# Patient Record
Sex: Male | Born: 1935 | Race: White | Hispanic: No | Marital: Married | Smoking: Former smoker
Health system: Southern US, Community
[De-identification: ages and names within clinical notes are randomized; demographics above are authoritative.]

## PROBLEM LIST (undated history)

## (undated) DIAGNOSIS — I4891 Unspecified atrial fibrillation: Secondary | ICD-10-CM

## (undated) DIAGNOSIS — I639 Cerebral infarction, unspecified: Secondary | ICD-10-CM

## (undated) DIAGNOSIS — E041 Nontoxic single thyroid nodule: Secondary | ICD-10-CM

## (undated) DIAGNOSIS — G459 Transient cerebral ischemic attack, unspecified: Secondary | ICD-10-CM

## (undated) DIAGNOSIS — Z992 Dependence on renal dialysis: Secondary | ICD-10-CM

## (undated) DIAGNOSIS — C61 Malignant neoplasm of prostate: Secondary | ICD-10-CM

## (undated) DIAGNOSIS — I739 Peripheral vascular disease, unspecified: Secondary | ICD-10-CM

## (undated) DIAGNOSIS — E782 Mixed hyperlipidemia: Secondary | ICD-10-CM

## (undated) DIAGNOSIS — G40909 Epilepsy, unspecified, not intractable, without status epilepticus: Secondary | ICD-10-CM

## (undated) DIAGNOSIS — J449 Chronic obstructive pulmonary disease, unspecified: Secondary | ICD-10-CM

## (undated) DIAGNOSIS — I779 Disorder of arteries and arterioles, unspecified: Secondary | ICD-10-CM

## (undated) DIAGNOSIS — N186 End stage renal disease: Secondary | ICD-10-CM

## (undated) DIAGNOSIS — I10 Essential (primary) hypertension: Secondary | ICD-10-CM

## (undated) DIAGNOSIS — Z9289 Personal history of other medical treatment: Secondary | ICD-10-CM

## (undated) DIAGNOSIS — H509 Unspecified strabismus: Secondary | ICD-10-CM

## (undated) HISTORY — DX: Unspecified strabismus: H50.9

## (undated) HISTORY — DX: Mixed hyperlipidemia: E78.2

## (undated) HISTORY — PX: PROSTATE SURGERY: SHX751

## (undated) HISTORY — DX: Transient cerebral ischemic attack, unspecified: G45.9

## (undated) HISTORY — DX: Chronic obstructive pulmonary disease, unspecified: J44.9

## (undated) HISTORY — DX: Cerebral infarction, unspecified: I63.9

## (undated) HISTORY — DX: Unspecified atrial fibrillation: I48.91

## (undated) HISTORY — DX: End stage renal disease: Z99.2

## (undated) HISTORY — DX: Epilepsy, unspecified, not intractable, without status epilepticus: G40.909

## (undated) HISTORY — DX: Essential (primary) hypertension: I10

## (undated) HISTORY — DX: Disorder of arteries and arterioles, unspecified: I77.9

## (undated) HISTORY — PX: INSERTION OF DIALYSIS CATHETER: SHX1324

## (undated) HISTORY — DX: Malignant neoplasm of prostate: C61

## (undated) HISTORY — DX: Peripheral vascular disease, unspecified: I73.9

## (undated) HISTORY — DX: End stage renal disease: N18.6

---
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
## 1973-08-29 HISTORY — PX: OTHER SURGICAL HISTORY: SHX169

## 2004-07-15 ENCOUNTER — Ambulatory Visit: Admission: RE | Admit: 2004-07-15 | Discharge: 2004-10-12 | Payer: Self-pay | Admitting: Radiation Oncology

## 2006-08-29 DIAGNOSIS — Z9289 Personal history of other medical treatment: Secondary | ICD-10-CM

## 2006-08-29 HISTORY — PX: OTHER SURGICAL HISTORY: SHX169

## 2006-08-29 HISTORY — DX: Personal history of other medical treatment: Z92.89

## 2007-05-25 ENCOUNTER — Ambulatory Visit (HOSPITAL_COMMUNITY): Admission: RE | Admit: 2007-05-25 | Discharge: 2007-05-25 | Payer: Self-pay | Admitting: Internal Medicine

## 2008-08-29 HISTORY — PX: HERNIA REPAIR: SHX51

## 2009-11-12 ENCOUNTER — Encounter: Payer: Self-pay | Admitting: Cardiology

## 2009-11-13 ENCOUNTER — Ambulatory Visit: Payer: Self-pay | Admitting: Cardiology

## 2009-11-13 ENCOUNTER — Encounter: Payer: Self-pay | Admitting: Cardiology

## 2009-11-15 ENCOUNTER — Encounter: Payer: Self-pay | Admitting: Cardiology

## 2009-11-16 ENCOUNTER — Encounter: Payer: Self-pay | Admitting: Cardiology

## 2009-11-17 ENCOUNTER — Encounter: Payer: Self-pay | Admitting: Cardiology

## 2009-11-17 ENCOUNTER — Inpatient Hospital Stay (HOSPITAL_COMMUNITY)
Admission: RE | Admit: 2009-11-17 | Discharge: 2009-12-01 | Payer: Self-pay | Admitting: Physical Medicine & Rehabilitation

## 2009-11-17 ENCOUNTER — Ambulatory Visit: Payer: Self-pay | Admitting: Physical Medicine & Rehabilitation

## 2009-11-17 ENCOUNTER — Ambulatory Visit: Payer: Self-pay | Admitting: Internal Medicine

## 2009-11-18 ENCOUNTER — Encounter: Payer: Self-pay | Admitting: Cardiology

## 2009-11-19 ENCOUNTER — Encounter: Payer: Self-pay | Admitting: Internal Medicine

## 2009-11-24 ENCOUNTER — Encounter: Payer: Self-pay | Admitting: Cardiology

## 2009-12-01 ENCOUNTER — Encounter (INDEPENDENT_AMBULATORY_CARE_PROVIDER_SITE_OTHER): Payer: Self-pay | Admitting: *Deleted

## 2009-12-11 ENCOUNTER — Encounter: Payer: Self-pay | Admitting: Cardiology

## 2009-12-23 ENCOUNTER — Ambulatory Visit (HOSPITAL_COMMUNITY): Admission: RE | Admit: 2009-12-23 | Discharge: 2009-12-23 | Payer: Self-pay | Admitting: Internal Medicine

## 2009-12-23 ENCOUNTER — Encounter: Payer: Self-pay | Admitting: Cardiology

## 2009-12-25 ENCOUNTER — Encounter: Payer: Self-pay | Admitting: Cardiology

## 2010-01-04 ENCOUNTER — Encounter
Admission: RE | Admit: 2010-01-04 | Discharge: 2010-01-05 | Payer: Self-pay | Admitting: Physical Medicine & Rehabilitation

## 2010-01-05 ENCOUNTER — Ambulatory Visit: Payer: Self-pay | Admitting: Physical Medicine & Rehabilitation

## 2010-01-05 ENCOUNTER — Encounter: Payer: Self-pay | Admitting: Cardiology

## 2010-01-08 DIAGNOSIS — G8190 Hemiplegia, unspecified affecting unspecified side: Secondary | ICD-10-CM | POA: Insufficient documentation

## 2010-01-08 DIAGNOSIS — I4891 Unspecified atrial fibrillation: Secondary | ICD-10-CM | POA: Insufficient documentation

## 2010-01-08 DIAGNOSIS — I6340 Cerebral infarction due to embolism of unspecified cerebral artery: Secondary | ICD-10-CM | POA: Insufficient documentation

## 2010-01-08 DIAGNOSIS — N189 Chronic kidney disease, unspecified: Secondary | ICD-10-CM | POA: Insufficient documentation

## 2010-01-11 ENCOUNTER — Ambulatory Visit: Payer: Self-pay | Admitting: Cardiology

## 2010-01-11 DIAGNOSIS — I10 Essential (primary) hypertension: Secondary | ICD-10-CM | POA: Insufficient documentation

## 2010-03-23 ENCOUNTER — Encounter: Payer: Self-pay | Admitting: Cardiology

## 2010-04-02 ENCOUNTER — Encounter: Payer: Self-pay | Admitting: Cardiology

## 2010-04-08 ENCOUNTER — Ambulatory Visit: Payer: Self-pay | Admitting: Cardiology

## 2010-04-08 DIAGNOSIS — I6529 Occlusion and stenosis of unspecified carotid artery: Secondary | ICD-10-CM | POA: Insufficient documentation

## 2010-04-08 DIAGNOSIS — E782 Mixed hyperlipidemia: Secondary | ICD-10-CM | POA: Insufficient documentation

## 2010-06-17 ENCOUNTER — Encounter: Payer: Self-pay | Admitting: Cardiology

## 2010-06-30 ENCOUNTER — Encounter: Payer: Self-pay | Admitting: Cardiology

## 2010-07-09 ENCOUNTER — Encounter: Payer: Self-pay | Admitting: Cardiology

## 2010-07-30 ENCOUNTER — Encounter: Payer: Self-pay | Admitting: Cardiology

## 2010-08-09 ENCOUNTER — Encounter: Payer: Self-pay | Admitting: Cardiology

## 2010-08-09 ENCOUNTER — Ambulatory Visit: Payer: Self-pay | Admitting: Cardiology

## 2010-09-28 NOTE — Letter (Signed)
Summary: [facility] D/C DR. VYAS  [facility] D/C DR. VYAS   Imported By: Zachary George 01/08/2010 16:04:10  _____________________________________________________________________  External Attachment:    Type:   Image     Comment:   External Document

## 2010-09-28 NOTE — Consult Note (Signed)
Summary: CARDIOLOGY CONSULT/ [facility]  [facility]ARDIOLOGY CONSULT/ [facility]   Imported By: Zachary George 01/08/2010 16:12:15  _____________________________________________________________________  External Attachment:    Type:   Image     Comment:   External Document

## 2010-09-28 NOTE — Letter (Signed)
Summary: [place] D/C DR. SWARTZ  [place] D/C DR. ZOXWRU   Imported By: Zachary George 01/08/2010 16:15:22  _____________________________________________________________________  External Attachment:    Type:   Image     Comment:   External Document

## 2010-09-28 NOTE — Letter (Signed)
Summary: [facility]  [facility]   Imported By: Zachary George 01/08/2010 16:03:15  _____________________________________________________________________  External Attachment:    Type:   Image     Comment:   External Document

## 2010-09-28 NOTE — Consult Note (Signed)
Summary: DR. Lewayne Bunting  DR. Nadira Single   Imported By: Zachary George 01/11/2010 09:39:53  _____________________________________________________________________  External Attachment:    Type:   Image     Comment:   External Document

## 2010-09-28 NOTE — Consult Note (Signed)
Summary: NEUROLOGY CONSUT/ [facility]  NEUROLOGY CONSUT/ [facility]   Imported By: Zachary George 01/08/2010 16:06:01  _____________________________________________________________________  External Attachment:    Type:   Image     Comment:   External Document

## 2010-09-28 NOTE — Letter (Signed)
Summary: External Correspondence/ [facility]ICE VISIT [facility]  External Correspondence/ [facility]   Imported By: Dorise Hiss 12/14/2009 14:52:13  _____________________________________________________________________  External Attachment:    Type:   Image     Comment:   External Document

## 2010-09-28 NOTE — Medication Information (Signed)
Summary: RX Folder/ [facility]  RX Folder/ [facility]   Imported By: Dorise Hiss 12/14/2009 14:54:43  _____________________________________________________________________  External Attachment:    Type:   Image     Comment:   External Document

## 2010-09-28 NOTE — Assessment & Plan Note (Signed)
Summary: [facility]   Visit Type:  Follow-up Primary Provider:  Dr. Doreen Beam  CC:  atrial fibrillation.  History of Present Illness: The patient presents for followup of atrial fibrillation. He was hospitalized in March with a stroke and found to be in fibrillation. He has rehabilitation nicely from this. He has been on chronic Coumadin and tolerating this. He does have some residual balance problem, memory problem and left hand numbness. Otherwise he is doing well. He denies any palpitations and has never really felt this rhythm. Prior to the stroke unit in a vigorously active gentleman without cardiovascular history or complaints. He was not having chest pressure, neck or arm discomfort. He was not having palpitations, presyncope or syncope. He was not having any shortness of breath, PND or orthopnea. Of note he had an echocardiogram demonstrating a well-preserved ejection fraction and no significant abnormalities. He did have some mild bilateral carotid stenosis identified.  Preventive Screening-Counseling & Management  Alcohol-Tobacco     Smoking Status: quit     Year Started: 20 yrs +     Year Quit: 1987  Current Medications (verified): 1)  Warfarin Sodium 5 Mg Tabs (Warfarin Sodium) .... Take As Directed By Vyas 2)  Simvastatin 40 Mg Tabs (Simvastatin) .... Take 1/2 Tab At Bedtime 3)  Metoprolol Tartrate 25 Mg Tabs (Metoprolol Tartrate) .... Take 1 Tablet By Mouth Two Times A Day 4)  Amlodipine Besylate 10 Mg Tabs (Amlodipine Besylate) .... Take 1 Tablet By Mouth Once A Day 5)  Vitamin C 500 Mg Tabs (Ascorbic Acid) .... Take 1 Tablet By Mouth Once A Day 6)  Daily Vitamins  Tabs (Multiple Vitamin) .... Take 1 Tablet By Mouth Once A Day  Allergies (verified): No Known Drug Allergies  Past History:  Past Medical History:  1. Renal cancer with left nephrectomy in 2008.   2. Chronic kidney disease with baseline creatinine of 1.8 to 1.9       range.   3. Prostate cancer,  treated with XRT in 2006.   4. Right inguinal hernia repair.   5. History of strabismus, left eye, with decreased vision.   6. Left ankle collapse with eversion.   7. Hypertension.   8. Dyslipidemia.   9.History of TIA.   10. B.ilateral carotid stenosis (50%)     Past Surgical History: Back surgery in 1975, prostate cancer with XRT in 2006, right inguinal hernia repair,left nephrectomy  Social History: Smoking Status:  quit  Review of Systems       As stated in the HPI and negative for all other systems.   Vital Signs:  Patient profile:   77 year old male Height:      72 inches Weight:      187.25 pounds BMI:     25.49 Pulse rate:   56 / minute BP sitting:   150 / 85  (left arm) Cuff size:   regular  Vitals Entered By: Hoover Brunette, LPN (Jan 11, 2010 10:51 AM)  Nutrition Counseling: Patient's BMI is greater than 25 and therefore counseled on weight management options. CC: atrial fibrillation Is Patient Diabetic? No   Physical Exam  General:  Well developed, well nourished, in no acute distress. Head:  normocephalic and atraumatic Mouth:   Oral mucosa normal. Neck:  Neck supple, no JVD. No masses, thyromegaly or abnormal cervical nodes. Chest Wall:  no deformities or breast masses noted Lungs:  Clear bilaterally to auscultation and percussion. Heart:  Non-displaced PMI, chest non-tender; irregular  rate and rhythm, S1, S2 without murmurs, rubs or gallops. Carotid upstroke normal, no bruit. Normal abdominal aortic size, no bruits. Femorals normal pulses, no bruits. Pedals normal pulses.  Abdomen:  Bowel sounds positive; abdomen soft and non-tender without masses, organomegaly, or hernias noted. No hepatosplenomegaly. Msk:  Back normal, normal gait. Muscle strength and tone normal. Extremities:  mild bilateral edema to mid calf Neurologic:  Alert and oriented x 3. Skin:  Intact without lesions or rashes. Psych:  Normal affect.   EKG  Procedure date:   01/11/2010  Findings:      atrial fibrillation, rate 66, axis within normal limits, right bundle branch block, no acute ST-T wave changes  Impression & Recommendations:  Problem # 1:  ATRIAL FIBRILLATION (ICD-427.31) The patient is doing well status post a CVA. He is tolerating Coumadin. He is asymptomatic with his atrial fibrillation. At this point I would not suggest to change in therapy. I do not think that there is a benefit to cardioversion in this situation. I had a long discussion with the patient, his son and wife. He will remain indefinitely on Coumadin. I would suggest he is not a Pradaxa candidate with his renal insufficiency. Orders: EKG w/ Interpretation (93000)  Problem # 2:  CAROTID STENOSIS (ICD-433.10) He will need followup of his carotid stenosis with a Doppler in about a year. This can be arranged when he follows up in this clinic.  Problem # 3:  ESSENTIAL HYPERTENSION, BENIGN (ICD-401.1) His blood pressure is mildly elevated. They should keep a blood pressure diary. I will not respond to this one reading with change in medications. Of note I do suspect that his lower extremity edema is related to amlodipine. Understanding this he still prefer not to stop this medication as this edema is not particularly troublesome.  Patient Instructions: 1)  Your physician wants you to follow-up in: 3 months. You will receive a reminder letter in the mail one-two months in advance. If you don't receive a letter, please call our office to schedule the follow-up appointment. 2)  Your physician recommends that you continue on your current medications as directed. Please refer to the Current Medication list given to you today.  Appended Document: [facility]    Clinical Lists Changes  Medications: Changed medication from METOPROLOL TARTRATE 25 MG TABS (METOPROLOL TARTRATE) Take 1 tablet by mouth two times a day to METOPROLOL TARTRATE 50 MG TABS (METOPROLOL TARTRATE) Take one  tablet by mouth twice a day - Signed Rx of METOPROLOL TARTRATE 50 MG TABS (METOPROLOL TARTRATE) Take one tablet by mouth twice a day;  #180 x 3;  Signed;  Entered by: Cyril Loosen, RN, BSN;  Authorized by: Rollene Rotunda, MD, [facility];  Method used: Electronically to [facility] [address], [place], [place], [place]  [address], Ph: [phone], Fax: [phone]    Prescriptions: METOPROLOL TARTRATE 50 MG TABS (METOPROLOL TARTRATE) Take one tablet by mouth twice a day  #180 x 3   Entered by:   Cyril Loosen, RN, BSN   Authorized by:   Rollene Rotunda, MD, [facility]   Signed by:   Cyril Loosen, RN, BSN on 01/11/2010   Method used:   Electronically to        [facility]* (retail)       [address]       [place], [place]  [address]       Ph: [phone]  Fax: [phone]   RxID:   [id number]

## 2010-09-28 NOTE — Assessment & Plan Note (Signed)
Summary: 71monthfollowup/rm   Visit Type:  Follow-up Primary Provider:  Dr. Doreen Beam   History of Present Illness: 77 year old male presents for followup. He has been followed most recently by Dr. Antoine Poche in our clinic. I saw him in consultation in [place] back in March of this year.  Recent followup labs from 5 August showed BUN 45, creatinine 2.2, sodium 137, potassium 4.8. Lipid profile from July showed cholesterol 125, HDL 39, LDL 67, triglycerides 95, AST 19, ALT 19.  He reports improved lower extremity edema following reduction in amlodipine dosing, and also the temporary addition of Lasix per Dr. Sherril Croon. Blood pressure has however trended up.  He has had no significant palpitations. He continues to do outpatient physical therapy has made significant progress following his stroke.  Preventive Screening-Counseling & Management  Alcohol-Tobacco     Smoking Status: quit     Year Quit: 1987  Current Medications (verified): 1)  Warfarin Sodium 5 Mg Tabs (Warfarin Sodium) .... Take As Directed By Vyas 2)  Simvastatin 40 Mg Tabs (Simvastatin) .... Take 1/2 Tab At Bedtime 3)  Metoprolol Tartrate 50 Mg Tabs (Metoprolol Tartrate) .... Take One Tablet By Mouth Twice A Day 4)  Amlodipine Besylate 10 Mg Tabs (Amlodipine Besylate) .... Take 1/2 Tablet By Mouth Once A Day 5)  Vitamin C 500 Mg Tabs (Ascorbic Acid) .... Take 1 Tablet By Mouth Once A Day 6)  Daily Vitamins  Tabs (Multiple Vitamin) .... Take 1 Tablet By Mouth Once A Day 7)  Furosemide 20 Mg Tabs (Furosemide) .... Take 1 Tablet By Mouth Once A Day  For 10 Days Per Vyas  Allergies (verified): No Known Drug Allergies  Comments:  Nurse/Medical Assistant: The patient's medication bottles and allergies were reviewed with the patient and were updated in the Medication and Allergy Lists.  Past History:  Social History: Last updated: 04/08/2010 Retired - [employer] work Married  Tobacco Use - Former.  Alcohol Use - no  Past  Medical History: Renal cancer with left nephrectomy Chronic kidney disease Prostate cancer, treated with XRT in 2006 History of strabismus, left eye, with decreased vision History of TIA Carotid artery disease Hyperlipidemia Hypertension Atrial Fibrillation Cerebrovascular Disease - right MCA stroke Diabetes Type 2  Past Surgical History: Back surgery Left nephrectomy Right inguinal hernia repair  Family History: Mother died at 70 with CVAs and the heart failure Father died at 48 of an MI     Social History: Retired - mill work Married  Tobacco Use - Former.  Alcohol Use - no  Review of Systems  The patient denies anorexia, fever, chest pain, syncope, dyspnea on exertion, peripheral edema, melena, and hematochezia.         Otherwise reviewed and negative.  Vital Signs:  Patient profile:   77 year old male Height:      72 inches Weight:      183 pounds Pulse rate:   78 / minute BP sitting:   149 / 90  (left arm) Cuff size:   regular  Vitals Entered By: Carlye Grippe (April 08, 2010 9:46 AM)  Serial Vital Signs/Assessments:  Time      Position  BP       Pulse  Resp  Temp     By 9:53 AM             144/85   64                    Carlye Grippe  Comments: 9:53  AM left arm/reg cuff  By: Carlye Grippe    Physical Exam  Additional Exam:  Normally nourished appearing male no acute distress. HEENT: Conjunctiva and lids normal, oropharynx clear. Neck: Supple, no elevated JVP or bruits. Lungs: Clear auscultation, nonlabored. Cardiac: Irregularly irregular, no S3. Abdomen: Soft, nontender, bowel sounds present. Skin: Warm and dry. Musculoskeletal: No kyphosis. Neuropsychiatric: Alert and oriented x3, affect appropriate.    Echocardiogram  Procedure date:  11/13/2009  Findings:      Normal left ventricular chamber size, mild left ventricular hypertrophy, and normal contraction with LVEF 60%. Normal right ventricular function. Mild mitral  regurgitation, trace tricuspid regurgitation.  Carotid Doppler  Procedure date:  11/13/2009  Findings:      Less than 50% stenosis in the right and left internal carotid arteries.  EKG  Procedure date:  04/08/2010  Findings:      Atrial fibrillation at 57 beats per minutes with right bundle branch block.  Impression & Recommendations:  Problem # 1:  ATRIAL FIBRILLATION (ICD-427.31)  Symptomatically stable. Continues on Coumadin without reported bleeding problems. Rate adequately controlled with metoprolol.  His updated medication list for this problem includes:    Warfarin Sodium 5 Mg Tabs (Warfarin sodium) .Marland Kitchen... Take as directed by vyas    Metoprolol Tartrate 50 Mg Tabs (Metoprolol tartrate) .Marland Kitchen... Take one tablet by mouth twice a day  Orders: EKG w/ Interpretation (93000)  Problem # 2:  ESSENTIAL HYPERTENSION, BENIGN (ICD-401.1)  Blood pressure trend is up. Amlodipine dose was reduced secondary to lower extremity edema. He feels much better symptomatically, and I expect he will probably come off of Lasix after review with Dr. Sherril Croon. Could consider additional blood pressure control measures such as hydralazine.  His updated medication list for this problem includes:    Metoprolol Tartrate 50 Mg Tabs (Metoprolol tartrate) .Marland Kitchen... Take one tablet by mouth twice a day    Amlodipine Besylate 10 Mg Tabs (Amlodipine besylate) .Marland Kitchen... Take 1/2 tablet by mouth once a day    Furosemide 20 Mg Tabs (Furosemide) .Marland Kitchen... Take 1 tablet by mouth once a day  for 10 days per vyas  Problem # 3:  MIXED HYPERLIPIDEMIA (ICD-272.2)  Tolerating simvastatin. LDL was at goal recently. Plan followup lipid profile liver function tests prior to his next visit.  His updated medication list for this problem includes:    Simvastatin 40 Mg Tabs (Simvastatin) .Marland Kitchen... Take 1/2 tab at bedtime  Problem # 4:  CAROTID ARTERY DISEASE (ICD-433.10)  Nonobstructive. Followup for carotid Dopplers sometime around March of  next year.  His updated medication list for this problem includes:    Warfarin Sodium 5 Mg Tabs (Warfarin sodium) .Marland Kitchen... Take as directed by vyas  Patient Instructions: 1)  Your physician wants you to follow-up in: 4 months. You will receive a reminder letter in the mail one-two months in advance. If you don't receive a letter, please call our office to schedule the follow-up appointment. 2)  Your physician recommends that you go to the [facility] for lab work: DO BEFORE YOU NEXT OFFICE VISIT. (FLP/LFT/BMET) 3)  Your physician recommends that you continue on your current medications as directed. Please refer to the Current Medication list given to you today.

## 2010-09-28 NOTE — Progress Notes (Signed)
Summary: OFFICE VISIT (DR.ZACHARY [phone])  OFFICE VISIT (DR.ZACHARY [phone])   Imported By: Zachary George 01/11/2010 09:33:28  _____________________________________________________________________  External Attachment:    Type:   Image     Comment:   External Document

## 2010-09-28 NOTE — Miscellaneous (Signed)
Summary: Orders Update  Clinical Lists Changes  Problems: Added new problem of LONG-TERM (CURRENT) USE OF OTHER MEDICATIONS (ICD-V58.69) Orders: Added new Test order of T-Lipid Profile [phone]) - Signed Added new Test order of T-Hepatic Function [phone]) - Signed Added new Test order of T-Basic Metabolic Panel [phone]) - Signed

## 2010-09-30 NOTE — Assessment & Plan Note (Signed)
Summary: 4 mo fu per dec remidner-srs   Visit Type:  Follow-up Primary Provider:  Dr. Doreen Beam   History of Present Illness: 77 year old male presents for followup. He was seen back in August. He is here with his wife and son. Reports recent follow up with Dr. Sherril Croon, and medication adjustments made just this past Friday.  Recent labs from 2 December showed BUN 45, creatinine 2.3, AST 18, ALT 13, cholesterol 131, triglycerides 120, HDL 42, LDL 65, sodium 139, potassium 4.8. We reviewed these. He states he recently had followup at [facility] with his history of nephrectomy.  He does not report any problems with progressive palpitations, no significant breathlessness or chest pain. Main limitation to advancing doses of Norvasc has been lower extremity edema.  He denies any bleeding problems on Coumadin.  Preventive Screening-Counseling & Management  Alcohol-Tobacco     Smoking Status: quit     Year Quit: 1987  Current Medications (verified): 1)  Warfarin Sodium 5 Mg Tabs (Warfarin Sodium) .... Take As Directed By Vyas 2)  Simvastatin 40 Mg Tabs (Simvastatin) .... Take 1/2 Tab At Bedtime 3)  Metoprolol Tartrate 50 Mg Tabs (Metoprolol Tartrate) .... Take One Tablet By Mouth Twice A Day 4)  Exforge 5-160 Mg Tabs (Amlodipine Besylate-Valsartan) .... Take 1 Tablet By Mouth Once A Day 5)  Vitamin C 500 Mg Tabs (Ascorbic Acid) .... Take 1 Tablet By Mouth Once A Day 6)  Daily Vitamins  Tabs (Multiple Vitamin) .... Take 1 Tablet By Mouth Once A Day  Allergies (verified): No Known Drug Allergies  Comments:  Nurse/Medical Assistant: The patient's medication list and allergies were reviewed with the patient and were updated in the Medication and Allergy Lists.  Past History:  Past Medical History: Last updated: 04/08/2010 Renal cancer with left nephrectomy Chronic kidney disease Prostate cancer, treated with XRT in 2006 History of strabismus, left eye, with decreased vision History of  TIA Carotid artery disease Hyperlipidemia Hypertension Atrial Fibrillation Cerebrovascular Disease - right MCA stroke Diabetes Type 2  Social History: Last updated: 04/08/2010 Retired - [employer] work Married  Tobacco Use - Former.  Alcohol Use - no  Review of Systems       The patient complains of peripheral edema.  The patient denies anorexia, fever, weight gain, chest pain, syncope, dyspnea on exertion, hemoptysis, melena, and hematochezia.         Otherwise reviewed and negative except as outlined.  Vital Signs:  Patient profile:   77 year old male Height:      72 inches Weight:      188 pounds Pulse rate:   56 / minute BP sitting:   160 / 95  (left arm) Cuff size:   large  Vitals Entered By: Carlye Grippe (August 09, 2010 10:45 AM)  Physical Exam  Additional Exam:  Normally nourished appearing male no acute distress. HEENT: Conjunctiva and lids normal, oropharynx clear. Neck: Supple, no elevated JVP or bruits. Lungs: Clear auscultation, nonlabored. Cardiac: Irregularly irregular, no S3. Abdomen: Soft, nontender, bowel sounds present. Skin: Warm and dry. Musculoskeletal: No kyphosis. Neuropsychiatric: Alert and oriented x3, affect appropriate.    EKG  Procedure date:  08/09/2010  Findings:      Atrial fibrillation at 63 beats per minute with right bundle branch block.  Impression & Recommendations:  Problem # 1:  ATRIAL FIBRILLATION (ICD-427.31)  Rate controlled, tolerating Coumadin and metoprolol. No changes made today. Continues to have INR check with Dr. Sherril Croon. ECG reviewed. Followup in 4 months.  His updated medication list for this problem includes:    Warfarin Sodium 5 Mg Tabs (Warfarin sodium) .Marland Kitchen... Take as directed by vyas    Metoprolol Tartrate 50 Mg Tabs (Metoprolol tartrate) .Marland Kitchen... Take one tablet by mouth twice a day  Problem # 2:  ESSENTIAL HYPERTENSION, BENIGN (ICD-401.1)  Not well-controlled. Patient recently switched from amlodipine to  Exforge as noted above, also taken off Lasix. Depending on his blood pressure control, follow up renal function, and peripheral edema, he may need additional adjustments. Not certain that he will tolerate advancing doses of Exforge related to the amlodipine component and his problems with edema in the past. Would give consideration to hydralazine as an adjunct.  The following medications were removed from the medication list:    Furosemide 20 Mg Tabs (Furosemide) .Marland Kitchen... Take 1 tablet by mouth once a day  for 10 days per vyas His updated medication list for this problem includes:    Metoprolol Tartrate 50 Mg Tabs (Metoprolol tartrate) .Marland Kitchen... Take one tablet by mouth twice a day    Exforge 5-160 Mg Tabs (Amlodipine besylate-valsartan) .Marland Kitchen... Take 1 tablet by mouth once a day  Problem # 3:  CHRONIC KIDNEY DISEASE UNSPECIFIED (ICD-585.9)  Recent renal function reviewed. Patient continues followup with [facility] and Dr. Sherril Croon.  Other Orders: EKG w/ Interpretation (93000)  Patient Instructions: 1)  Your physician wants you to follow-up in: 4 months. You will receive a reminder letter in the mail one-two months in advance. If you don't receive a letter, please call our office to schedule the follow-up appointment. 2)  Your physician recommends that you continue on your current medications as directed. Please refer to the Current Medication list given to you today.

## 2010-11-16 LAB — GLUCOSE, CAPILLARY: Glucose-Capillary: 102 mg/dL — ABNORMAL HIGH (ref 70–99)

## 2010-11-17 LAB — CBC
HCT: 35 % — ABNORMAL LOW (ref 39.0–52.0)
Hemoglobin: 12.1 g/dL — ABNORMAL LOW (ref 13.0–17.0)
MCHC: 34.3 g/dL (ref 30.0–36.0)
MCV: 93.2 fL (ref 78.0–100.0)
Platelets: 256 10*3/uL (ref 150–400)
RBC: 3.98 MIL/uL — ABNORMAL LOW (ref 4.22–5.81)
RDW: 12.9 % (ref 11.5–15.5)
RDW: 13 % (ref 11.5–15.5)
WBC: 6.2 10*3/uL (ref 4.0–10.5)

## 2010-11-17 LAB — BASIC METABOLIC PANEL
BUN: 58 mg/dL — ABNORMAL HIGH (ref 6–23)
CO2: 23 mEq/L (ref 19–32)
Calcium: 8.6 mg/dL (ref 8.4–10.5)
Chloride: 109 mEq/L (ref 96–112)
GFR calc Af Amer: 32 mL/min — ABNORMAL LOW (ref >60)
GFR calc non Af Amer: 27 mL/min — ABNORMAL LOW (ref >60)
Glucose, Bld: 103 mg/dL — ABNORMAL HIGH (ref 70–99)
Potassium: 4.8 mEq/L (ref 3.5–5.1)

## 2010-11-17 LAB — PROTIME-INR
INR: 1.92 — ABNORMAL HIGH (ref 0.00–1.49)
INR: 2.18 — ABNORMAL HIGH (ref 0.00–1.49)
Prothrombin Time: 20.9 seconds — ABNORMAL HIGH (ref 11.6–15.2)

## 2010-11-22 LAB — BASIC METABOLIC PANEL
Calcium: 8.6 mg/dL (ref 8.4–10.5)
Chloride: 109 mEq/L (ref 96–112)
Creatinine, Ser: 2.25 mg/dL — ABNORMAL HIGH (ref 0.4–1.5)
GFR calc Af Amer: 33 mL/min — ABNORMAL LOW (ref >60)
GFR calc Af Amer: 34 mL/min — ABNORMAL LOW (ref >60)
GFR calc non Af Amer: 27 mL/min — ABNORMAL LOW (ref >60)
Potassium: 4.9 mEq/L (ref 3.5–5.1)
Sodium: 139 mEq/L (ref 135–145)

## 2010-11-22 LAB — COMPREHENSIVE METABOLIC PANEL
ALT: 17 U/L (ref 0–53)
AST: 25 U/L (ref 0–37)
Alkaline Phosphatase: 68 U/L (ref 39–117)
BUN: 24 mg/dL — ABNORMAL HIGH (ref 6–23)
CO2: 22 mEq/L (ref 19–32)
CO2: 22 mEq/L (ref 19–32)
Calcium: 9 mg/dL (ref 8.4–10.5)
Calcium: 9.1 mg/dL (ref 8.4–10.5)
Chloride: 111 mEq/L (ref 96–112)
Creatinine, Ser: 1.81 mg/dL — ABNORMAL HIGH (ref 0.4–1.5)
GFR calc Af Amer: 43 mL/min — ABNORMAL LOW (ref >60)
GFR calc non Af Amer: 36 mL/min — ABNORMAL LOW (ref >60)
GFR calc non Af Amer: 37 mL/min — ABNORMAL LOW (ref >60)
Potassium: 4.1 mEq/L (ref 3.5–5.1)
Sodium: 139 mEq/L (ref 135–145)
Total Bilirubin: 0.5 mg/dL (ref 0.3–1.2)

## 2010-11-22 LAB — CBC
HCT: 36.1 % — ABNORMAL LOW (ref 39.0–52.0)
HCT: 36.4 % — ABNORMAL LOW (ref 39.0–52.0)
HCT: 39.9 % (ref 39.0–52.0)
Hemoglobin: 12.2 g/dL — ABNORMAL LOW (ref 13.0–17.0)
MCHC: 34.2 g/dL (ref 30.0–36.0)
MCHC: 34.5 g/dL (ref 30.0–36.0)
MCV: 93.6 fL (ref 78.0–100.0)
MCV: 93.6 fL (ref 78.0–100.0)
Platelets: 206 10*3/uL (ref 150–400)
Platelets: 235 10*3/uL (ref 150–400)
Platelets: 264 10*3/uL (ref 150–400)
RBC: 3.89 MIL/uL — ABNORMAL LOW (ref 4.22–5.81)
RBC: 4.27 MIL/uL (ref 4.22–5.81)
RDW: 12.6 % (ref 11.5–15.5)
WBC: 5.4 10*3/uL (ref 4.0–10.5)
WBC: 5.7 10*3/uL (ref 4.0–10.5)
WBC: 5.9 10*3/uL (ref 4.0–10.5)
WBC: 6.3 10*3/uL (ref 4.0–10.5)
WBC: 6.7 10*3/uL (ref 4.0–10.5)

## 2010-11-22 LAB — LIPID PANEL
Cholesterol: 126 mg/dL (ref 0–200)
LDL Cholesterol: 72 mg/dL (ref 0–99)
VLDL: 18 mg/dL (ref 0–40)

## 2010-11-22 LAB — HOMOCYSTEINE: Homocysteine: 14.4 umol/L (ref 4.0–15.4)

## 2010-11-22 LAB — PROTIME-INR
INR: 1.91 — ABNORMAL HIGH (ref 0.00–1.49)
INR: 2.24 — ABNORMAL HIGH (ref 0.00–1.49)
INR: 2.43 — ABNORMAL HIGH (ref 0.00–1.49)
INR: 2.69 — ABNORMAL HIGH (ref 0.00–1.49)
Prothrombin Time: 21.7 seconds — ABNORMAL HIGH (ref 11.6–15.2)
Prothrombin Time: 24.6 seconds — ABNORMAL HIGH (ref 11.6–15.2)
Prothrombin Time: 25.4 seconds — ABNORMAL HIGH (ref 11.6–15.2)
Prothrombin Time: 28.4 seconds — ABNORMAL HIGH (ref 11.6–15.2)

## 2010-11-22 LAB — DIFFERENTIAL
Basophils Absolute: 0 10*3/uL (ref 0.0–0.1)
Lymphocytes Relative: 17 % (ref 12–46)
Lymphs Abs: 1.1 10*3/uL (ref 0.7–4.0)
Neutro Abs: 4.1 10*3/uL (ref 1.7–7.7)
Neutrophils Relative %: 65 % (ref 43–77)

## 2010-11-22 LAB — HEPARIN LEVEL (UNFRACTIONATED)
Heparin Unfractionated: 0.22 IU/mL — ABNORMAL LOW (ref 0.30–0.70)
Heparin Unfractionated: 0.37 IU/mL (ref 0.30–0.70)

## 2010-11-22 LAB — CARDIAC PANEL(CRET KIN+CKTOT+MB+TROPI): CK, MB: 4.2 ng/mL — ABNORMAL HIGH (ref 0.3–4.0)

## 2010-12-15 ENCOUNTER — Encounter: Payer: Self-pay | Admitting: Cardiology

## 2010-12-16 ENCOUNTER — Encounter: Payer: Self-pay | Admitting: Cardiology

## 2010-12-16 ENCOUNTER — Ambulatory Visit (INDEPENDENT_AMBULATORY_CARE_PROVIDER_SITE_OTHER): Payer: Medicare Other | Admitting: Cardiology

## 2010-12-16 VITALS — BP 148/86 | HR 56 | Ht 72.0 in | Wt 187.0 lb

## 2010-12-16 DIAGNOSIS — I4891 Unspecified atrial fibrillation: Secondary | ICD-10-CM

## 2010-12-16 NOTE — Assessment & Plan Note (Signed)
Followed by primary care, LDL has been at goal.

## 2010-12-16 NOTE — Patient Instructions (Signed)
Your physician wants you to follow-up in: 6 months. You will receive a reminder letter in the mail one-two months in advance. If you don't receive a letter, please call our office to schedule the follow-up appointment. Your physician recommends that you continue on your current medications as directed. Please refer to the Current Medication list given to you today. 

## 2010-12-16 NOTE — Progress Notes (Signed)
Clinical Summary Mr. Gieselman is a 77 y.o.male presenting for followup. He was seen in December 2011.  Lab work from December 2011 showed BUN 45, creatinine 2.3, AST 18, ALT 13, cholesterol 131, triglycerides 120, HDL 42, LDL 65, sodium 139, potassium 4.8.  He reports no significant palpitations or chest pain, indicates NYHA class 2-3 dyspnea exertion which has been stable. Reports diagnosis of seizure disorder by Dr. Ninetta Lights, now on Keppra.  Denies any major bleeding problems on Coumadin.   No Known Allergies  Current outpatient prescriptions:amLODipine (NORVASC) 5 MG tablet, Take 5 mg by mouth daily.  , Disp: , Rfl: ;  levETIRAcetam (KEPPRA) 500 MG tablet, Take 500 mg by mouth every 12 (twelve) hours.  , Disp: , Rfl: ;  losartan (COZAAR) 100 MG tablet, Take 100 mg by mouth daily.  , Disp: , Rfl: ;  metoprolol (LOPRESSOR) 50 MG tablet, Take 50 mg by mouth 2 (two) times daily.  , Disp: , Rfl:  simvastatin (ZOCOR) 40 MG tablet, Take 20 mg by mouth at bedtime.  , Disp: , Rfl: ;  warfarin (COUMADIN) 5 MG tablet, Take 5 mg by mouth as directed.  , Disp: , Rfl: ;  DISCONTD: amLODipine-valsartan (EXFORGE) 5-160 MG per tablet, Take 1 tablet by mouth daily.  , Disp: , Rfl: ;  DISCONTD: Multiple Vitamin (MULTIVITAMIN) tablet, Take 1 tablet by mouth daily.  , Disp: , Rfl:   Past Medical History  Diagnosis Date  . Renal cell carcinoma   . Chronic kidney disease   . Prostate cancer     XRT 2006  . Strabismus   . TIA (transient ischemic attack)   . Carotid artery disease   . Mixed hyperlipidemia   . Essential hypertension, benign   . Atrial fibrillation   . Stroke     Right MCA  . Type 2 diabetes mellitus   . Seizure disorder     Social History Mr. Eddins reports that he quit smoking about 25 years ago. His smoking use included Cigarettes. He has a 80 pack-year smoking history. He has never used smokeless tobacco. Mr. Weninger reports that he does not drink alcohol.  Review of Systems As  outlined above, otherwise reviewed and negative.  Physical Examination Filed Vitals:   12/16/10 1028  BP: 148/86  Pulse: 56   Normally nourished appearing male no acute distress. HEENT: Conjunctiva and lids normal, oropharynx clear. Neck: Supple, no elevated JVP or bruits. Lungs: Clear auscultation, nonlabored. Cardiac: Irregularly irregular, no S3. Abdomen: Soft, nontender, bowel sounds present. Skin: Warm and dry. Musculoskeletal: No kyphosis. Neuropsychiatric: Alert and oriented x3, affect appropriate.   Problem List and Plan

## 2010-12-16 NOTE — Assessment & Plan Note (Signed)
Blood pressure trend somewhat better. Medication adjustments have been made since the last visit, and he is having less problems with lower extremity edema.

## 2010-12-16 NOTE — Assessment & Plan Note (Signed)
No palpitations. Continue present regimen including Coumadin.

## 2011-02-25 ENCOUNTER — Encounter: Payer: Self-pay | Admitting: Cardiology

## 2011-06-07 ENCOUNTER — Ambulatory Visit (INDEPENDENT_AMBULATORY_CARE_PROVIDER_SITE_OTHER): Payer: Medicare Other | Admitting: Cardiology

## 2011-06-07 ENCOUNTER — Encounter: Payer: Self-pay | Admitting: Cardiology

## 2011-06-07 VITALS — BP 165/83 | HR 54 | Ht 72.0 in | Wt 199.0 lb

## 2011-06-07 DIAGNOSIS — I6529 Occlusion and stenosis of unspecified carotid artery: Secondary | ICD-10-CM

## 2011-06-07 DIAGNOSIS — I4891 Unspecified atrial fibrillation: Secondary | ICD-10-CM

## 2011-06-07 DIAGNOSIS — I10 Essential (primary) hypertension: Secondary | ICD-10-CM

## 2011-06-07 DIAGNOSIS — E782 Mixed hyperlipidemia: Secondary | ICD-10-CM

## 2011-06-07 MED ORDER — METOPROLOL TARTRATE 50 MG PO TABS: 25.0000 mg | ORAL_TABLET | Freq: Two times a day (BID) | ORAL | Status: DC

## 2011-06-07 NOTE — Assessment & Plan Note (Signed)
No active palpitations. I wonder if any of his exertional fatigue and shortness of breath could be related to relatively slow heart rates on beta blocker therapy. I have asked him to decrease Lopressor 25 mg twice daily to see if he notices any improvement. He will let us know by phone. Otherwise continue Coumadin.

## 2011-06-07 NOTE — Assessment & Plan Note (Signed)
Less than 50% stenosis bilateral internal carotids in March 2011.

## 2011-06-07 NOTE — Assessment & Plan Note (Signed)
Continue followup with Dr. Sherril Croon.

## 2011-06-07 NOTE — Assessment & Plan Note (Signed)
Blood pressure is up today. Patient states that this has not been the case typically. I asked him to record blood pressures at home. Aiming for systolics under 140.

## 2011-06-07 NOTE — Patient Instructions (Addendum)
   Decrease Metoprolol to 25mg  twice a day    If not noticing any change with decreased dose, may go back to previous (1-2 weeks) Your physician wants you to follow up in: 6 months.  You will receive a reminder letter in the mail one-two months in advance.  If you don't receive a letter, please call our office to schedule the follow up appointment

## 2011-06-07 NOTE — Progress Notes (Signed)
Clinical Summary Devin Diaz is a 77 y.o.male presenting for followup. He was seen back in April. He reports generally doing well, no active angina or palpitations. He does have exertional fatigue and shortness of breath which has been stable, NYHA class 2-3.  He denies any significant bleeding problems on Coumadin. Reports compliance with his beta blocker. Also mentions that his heart rate has always been on the slow side. He has had no dizziness or syncope. No new motor weakness, speech problems, or memory deficits.   No Known Allergies  Medication list reviewed.  Past Medical History  Diagnosis Date  . Renal cell carcinoma   . Chronic kidney disease   . Prostate cancer     XRT 2006  . Strabismus   . TIA (transient ischemic attack)   . Carotid artery disease   . Mixed hyperlipidemia   . Essential hypertension, benign   . Atrial fibrillation   . Stroke     Right MCA  . Type 2 diabetes mellitus   . Seizure disorder     Past Surgical History  Procedure Date  . Left nephrectomy     No family history on file.  Social History Devin Diaz reports that he quit smoking about 25 years ago. His smoking use included Cigarettes. He has a 80 pack-year smoking history. He has never used smokeless tobacco. Devin Diaz reports that he does not drink alcohol.  Review of Systems As outlined above, otherwise negative.  Physical Examination Filed Vitals:   06/07/11 0912  BP: 165/83  Pulse: 54    Normally nourished appearing male no acute distress.  HEENT: Conjunctiva and lids normal, oropharynx clear.  Neck: Supple, no elevated JVP or bruits.  Lungs: Clear auscultation, nonlabored.  Cardiac: Irregularly irregular, no S3.  Abdomen: Soft, nontender, bowel sounds present.  Skin: Warm and dry.  Musculoskeletal: No kyphosis.  Neuropsychiatric: Alert and oriented x3, affect appropriate.   ECG Atrial fibrillation at 54 beats per minute with right bundle branch block, small inferior  Q waves.    Problem List and Plan

## 2011-10-23 IMAGING — CT NM PET TUM IMG SKULL BASE T - THIGH
6 series · 25 of 25 positions shown · IV contrast ([ID])
Comparison: 05/25/2007

CLINICAL DATA: Subsequent treatment strategy for renal cell
carcinoma and prostate carcinoma.  Left nephrectomy in 5003.
Radiation therapy for prostate carcinoma 9224.

NUCLEAR MEDICINE PET CT SKULL BASE TO THIGH
TECHNIQUE: 14.5 mCi F-18 FDG was injected intravenously via the
right AC.  Full-ring PET imaging was performed from the skull base
through the mid-thighs 58  minutes after injection.  CT data was
obtained and used for attenuation correction and anatomic
localization only.  (This was not acquired as a diagnostic CT
examination.)
Fasting Blood Glucose:  102

[Series 1: pet ac · axial · 3.3mm · 4.69mm/px · z∈[-870,+0]mm · 5 of 267 slices shown]
[im 1/267]
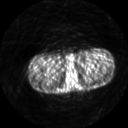
[im 67/267]
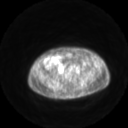
[im 134/267]
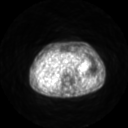
[im 200/267]
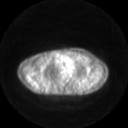
[im 267/267]
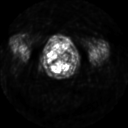

[Series 2: ct images · axial · 3.8mm · 0.98mm/px · z∈[-870,+0]mm · 5 of 267 slices shown]
[im 1/267]
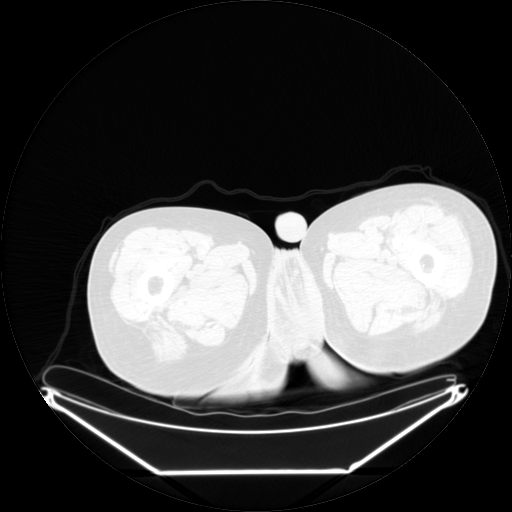
[im 67/267]
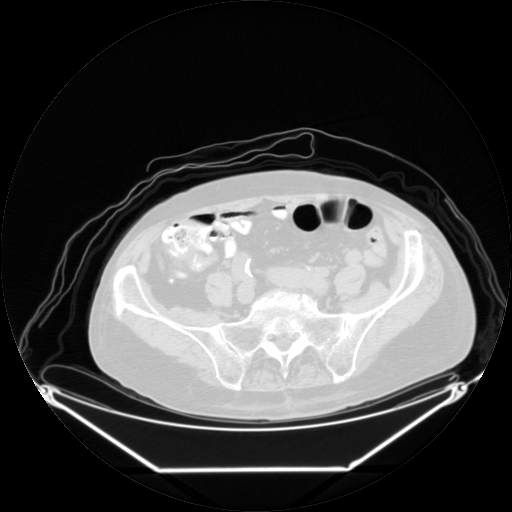
[im 134/267]
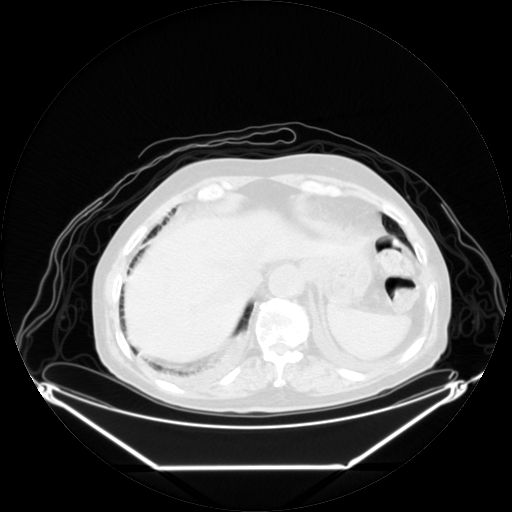
[im 200/267]
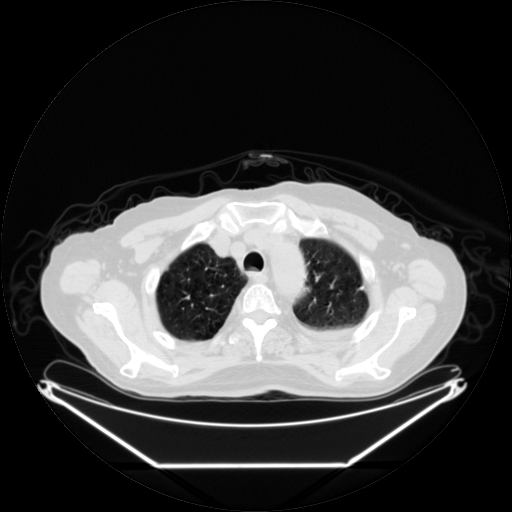
[im 267/267  brain]
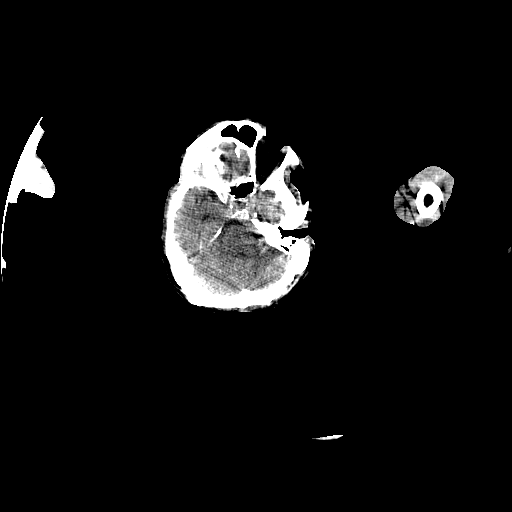

[Series 2: pet nac · axial · 3.3mm · 4.69mm/px · z∈[-870,+0]mm · 6 of 267 slices shown]
[im 1/267]
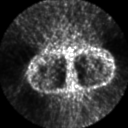
[im 54/267]
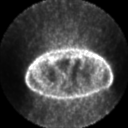
[im 107/267]
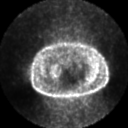
[im 160/267]
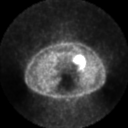
[im 213/267]
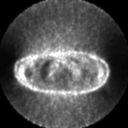
[im 267/267]
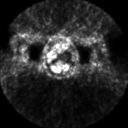

[Series 123: mip · coronal · 3.3mm · 4.69mm/px · 1 of 30 slices shown]
[im 1/30]
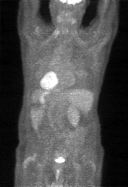

[Series 151: reformatted · axial · 3.3mm · 3.91mm/px · z∈[-870,+0]mm · 6 of 267 slices shown (1 of 2)]
[im 1/267]
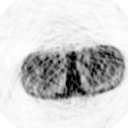
[im 54/267]
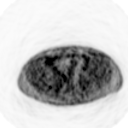
[im 107/267]
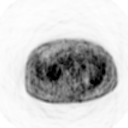
[im 160/267]
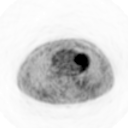
[im 213/267]
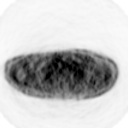
[im 267/267]
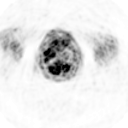

[Series 153: reformatted · coronal · 4.7mm · 6.98mm/px · 2 of 74 slices shown (2 of 2)]
[im 1/74]
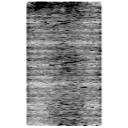
[im 74/74]
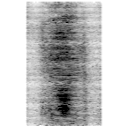

[25 of 25 positions shown; findings below may reference images not displayed]

FINDINGS: PET images demonstrate no abnormal activity within the
neck.  Nonspecific low level hypermetabolism corresponds to  small
bilateral pleural effusions.  Greater left than right.

No abnormal activity within the abdomen.  There is musculotendinous
hypermetabolism cephalad to the left femoral neck.  Likely related
to a strain.

CT images performed for attenuation correction demonstrate no
significant findings in the neck.  Mild cardiomegaly with dense
coronary artery atherosclerosis.  Small mediastinal lymph nodes
which are unchanged.  Centrilobular emphysema.  Probable scarring
in the right upper lobe on image 87 which is unchanged. Minimal
fluid tracking within the right major fissure including on image
101.  Calcified granuloma in the left upper lobe.

2 inter/lower pole right renal lesions which are new.  The more
lateral measures 20 HU and 1.2 cm.  The more medial measures fluid
density at approximately 1.0 cm.

Status post left nephrectomy.  A left adrenal nodule measures 2.4 x
1.6 cm on images 145 - 146.  Similar to 2.5 x 1.9 cm on the prior
exam.  Probable foci of fat again identified within.  Small free
pelvic fluid which is new since the prior exam on image 224.
Moderate osteopenia.
IMPRESSION: 1.  No evidence of hypermetabolic recurrent/metastatic disease.
2.  Small bilateral pleural effusions which demonstrate nonspecific
low level hypermetabolism.  Given the concurrent new cul-de-sac
trace fluid, question mild fluid overload.

3.  2 right renal lesions which are new since the prior exam.
Likely cysts and complex cysts respectively.  Given the history of
left-sided nephrectomy, comparison with interval imaging or
consideration of ultrasound suggested.
4.  Probable left adrenal myelolipoma, similar to on the prior
exam.

## 2011-12-05 ENCOUNTER — Ambulatory Visit (INDEPENDENT_AMBULATORY_CARE_PROVIDER_SITE_OTHER): Payer: Medicare Other | Admitting: Cardiology

## 2011-12-05 ENCOUNTER — Encounter: Payer: Self-pay | Admitting: Cardiology

## 2011-12-05 VITALS — BP 148/88 | HR 74 | Ht 72.0 in | Wt 200.4 lb

## 2011-12-05 DIAGNOSIS — R0989 Other specified symptoms and signs involving the circulatory and respiratory systems: Secondary | ICD-10-CM

## 2011-12-05 DIAGNOSIS — I10 Essential (primary) hypertension: Secondary | ICD-10-CM

## 2011-12-05 DIAGNOSIS — R0602 Shortness of breath: Secondary | ICD-10-CM

## 2011-12-05 DIAGNOSIS — I4891 Unspecified atrial fibrillation: Secondary | ICD-10-CM

## 2011-12-05 DIAGNOSIS — N189 Chronic kidney disease, unspecified: Secondary | ICD-10-CM

## 2011-12-05 NOTE — Progress Notes (Signed)
   Clinical Summary Devin Diaz is a 78 y.o.male presenting for followup. He was seen in October 2012. He is here with his wife. Still complains of shortness of breath with activity, no chest pain or palpitations. He tried to cut back beta blocker dosing based on my prior recommendations to see if this might help with shortness of breath. He has not noticed a tremendous difference.   He continues to have lower extremity edema, mainly involving the ankles, also some increased abdominal girth. He is due for followup lab work later this month to assess renal function. He does have chronic kidney disease with history of renal cell carcinoma.  He reports no bleeding problems on Coumadin.   No Known Allergies  Current Outpatient Prescriptions  Medication Sig Dispense Refill  . amLODipine (NORVASC) 5 MG tablet Take 5 mg by mouth daily.        Marland Kitchen levETIRAcetam (KEPPRA) 500 MG tablet Take 500 mg by mouth every 12 (twelve) hours.        Marland Kitchen losartan (COZAAR) 100 MG tablet Take 100 mg by mouth daily.       . metoprolol (LOPRESSOR) 50 MG tablet Take 0.5 tablets (25 mg total) by mouth 2 (two) times daily.      . simvastatin (ZOCOR) 40 MG tablet Take 20 mg by mouth at bedtime.        Marland Kitchen warfarin (COUMADIN) 5 MG tablet Take 5 mg by mouth as directed. Managed by Dr. Sherril Diaz        Past Medical History  Diagnosis Date  . Renal cell carcinoma   . Chronic kidney disease   . Prostate cancer     XRT 2006  . Strabismus   . TIA (transient ischemic attack)   . Carotid artery disease   . Mixed hyperlipidemia   . Essential hypertension, benign   . Atrial fibrillation   . Stroke     Right MCA  . Type 2 diabetes mellitus   . Seizure disorder     Social History Devin Diaz reports that he quit smoking about 26 years ago. His smoking use included Cigarettes. He has a 80 pack-year smoking history. He has never used smokeless tobacco. Devin Diaz reports that he does not drink alcohol.  Review of  Systems Negative except as outlined above.  Physical Examination Filed Vitals:   12/05/11 1312  BP: 148/88  Pulse: 74    Normally nourished appearing male no acute distress.  HEENT: Conjunctiva and lids normal, oropharynx clear.  Neck: Supple, no elevated JVP or bruits.  Lungs: Clear auscultation, nonlabored.  Cardiac: Irregularly irregular, no S3.  Abdomen: Soft, nontender, bowel sounds present.  Skin: Warm and dry.  Musculoskeletal: No kyphosis.  Extremities: 1-2+ edema below the knees with some stasis, distal pulses one plus. Neuropsychiatric: Alert and oriented x3, affect appropriate.   ECG Atrial fibrillation at 59 beats per minute with right bundle branch block. Inferior Q waves.   Problem List and Plan

## 2011-12-05 NOTE — Patient Instructions (Addendum)
Follow up in 2 months. Continue metoprolol 1/2 tablet two times a day.  Have Bmet drawn with lab work at [facility]. This should be faxed to [place]. Your physician has requested that you have an echocardiogram. Echocardiography is a painless test that uses sound waves to create images of your heart. It provides your doctor with information about the size and shape of your heart and how well your heart's chambers and valves are working. This procedure takes approximately one hour. There are no restrictions for this procedure.

## 2011-12-05 NOTE — Assessment & Plan Note (Signed)
No clear evidence of rapid rates, if anything remains bradycardic. Would continue to recommend a lower dose Lopressor. He continues on Coumadin.

## 2011-12-05 NOTE — Assessment & Plan Note (Signed)
Mildly elevated, continue same therapy for now.

## 2011-12-05 NOTE — Assessment & Plan Note (Signed)
Followup BMET pending. 

## 2011-12-05 NOTE — Assessment & Plan Note (Signed)
Followup complete echocardiogram to reassess LV EF. Could also be component of progressive renal insufficiency with fluid gain. May need to consider low-dose intermittent diuretic but would like to see followup blood work regarding renal function first.

## 2011-12-28 ENCOUNTER — Other Ambulatory Visit: Payer: Medicare Other

## 2011-12-28 ENCOUNTER — Encounter: Payer: Medicare Other | Admitting: Internal Medicine

## 2012-01-04 ENCOUNTER — Other Ambulatory Visit: Payer: Medicare Other

## 2012-01-04 ENCOUNTER — Other Ambulatory Visit (INDEPENDENT_AMBULATORY_CARE_PROVIDER_SITE_OTHER): Payer: Medicare Other

## 2012-01-04 ENCOUNTER — Telehealth: Payer: Self-pay | Admitting: *Deleted

## 2012-01-04 ENCOUNTER — Other Ambulatory Visit: Payer: Self-pay

## 2012-01-04 DIAGNOSIS — R0602 Shortness of breath: Secondary | ICD-10-CM

## 2012-01-04 DIAGNOSIS — I4891 Unspecified atrial fibrillation: Secondary | ICD-10-CM

## 2012-01-04 NOTE — Telephone Encounter (Signed)
Message copied by Arlyss Gandy on Wed Jan 04, 2012  3:29 PM ------      Message from: Jonelle Sidle      Created: Tue Jan 03, 2012  4:39 PM       Reviewed. Renal function has continued to worsen, creatinine up to 3.3, GFR less than 20. Potassium is normal at 4.3. AST and ALT normal. Please forward copy to patient's primary care physician Dr. Sherril Croon.

## 2012-01-04 NOTE — Telephone Encounter (Addendum)
Left message to call back on voicemail regarding results, which have been faxed to Dr. Sherril Croon.

## 2012-01-05 ENCOUNTER — Telehealth: Payer: Self-pay | Admitting: *Deleted

## 2012-01-05 NOTE — Telephone Encounter (Signed)
Return call from [place]. Should be home most to the day

## 2012-01-05 NOTE — Telephone Encounter (Signed)
Wife informed. Copy faxed to PCP([facility]) office.

## 2012-01-05 NOTE — Telephone Encounter (Signed)
Message copied by Eustace Moore on Thu Jan 05, 2012  4:27 PM ------      Message from: MCDOWELL, Illene Bolus      Created: Thu Jan 05, 2012  8:19 AM       Reviewed report. LV function mildly reduced in the range of 45-50% with diastolic dysfunction, inferior apical wall motion abnormality, mild left atrial enlargement. Pulmonary pressures are not significantly elevated. Can review with him further at office followup. Will likely require further medication adjustments, potentially additional testing.

## 2012-01-05 NOTE — Telephone Encounter (Signed)
Wife informed

## 2012-01-05 NOTE — Telephone Encounter (Signed)
Patient's wife informed. BMET faxed to [facility] office.

## 2012-01-05 NOTE — Telephone Encounter (Signed)
Message copied by Eustace Moore on Thu Jan 05, 2012  4:30 PM ------      Message from: Jonelle Sidle      Created: Tue Jan 03, 2012  4:39 PM       Reviewed. Renal function has continued to worsen, creatinine up to 3.3, GFR less than 20. Potassium is normal at 4.3. AST and ALT normal. Please forward copy to patient's primary care physician Dr. Sherril Croon.

## 2012-02-06 ENCOUNTER — Ambulatory Visit (INDEPENDENT_AMBULATORY_CARE_PROVIDER_SITE_OTHER): Payer: Medicare Other | Admitting: Cardiology

## 2012-02-06 ENCOUNTER — Encounter: Payer: Self-pay | Admitting: Cardiology

## 2012-02-06 VITALS — BP 150/81 | HR 57 | Ht 72.0 in | Wt 196.8 lb

## 2012-02-06 DIAGNOSIS — R0602 Shortness of breath: Secondary | ICD-10-CM

## 2012-02-06 DIAGNOSIS — N189 Chronic kidney disease, unspecified: Secondary | ICD-10-CM

## 2012-02-06 DIAGNOSIS — I10 Essential (primary) hypertension: Secondary | ICD-10-CM

## 2012-02-06 DIAGNOSIS — I4891 Unspecified atrial fibrillation: Secondary | ICD-10-CM

## 2012-02-06 MED ORDER — METOPROLOL SUCCINATE ER 25 MG PO TB24: 12.5000 mg | ORAL_TABLET | Freq: Every day | ORAL | Status: DC

## 2012-02-06 NOTE — Progress Notes (Signed)
Clinical Summary Mr. Oettinger is a 78 y.o.male presenting for followup. He was seen in April. Overall, he reports no change in functional limitation, shortness of breath. He has had no palpitations or chest pain.  Lab work from April showed worsening renal function, BUN 53, creatinine 3.3, potassium 4.5, AST 16, ALT 13.  Echocardiogram from May showed LVEF of 45-50% with diastolic dysfunction, inferior apical akinesis, mild left atrial enlargement, no significant degree of pulmonary hypertension.  ECG today shows atrial fibrillation with slow ventricular response of 57, right bundle branch block, probable old inferior infarct pattern.  Today we discussed medical therapy adjustments. His Norvasc has been uptitrated since last visit for blood pressure control. Renal insufficiency also limits his therapy somewhat. We discussed the change from Lopressor to Toprol-XL at low dose.  No Known Allergies  Current Outpatient Prescriptions  Medication Sig Dispense Refill  . amLODipine (NORVASC) 5 MG tablet Take 10 mg by mouth daily.       Marland Kitchen levETIRAcetam (KEPPRA) 500 MG tablet Take 500 mg by mouth every 12 (twelve) hours.        Marland Kitchen losartan (COZAAR) 100 MG tablet Take 100 mg by mouth daily.       . metoprolol (LOPRESSOR) 50 MG tablet Take 0.5 tablets (25 mg total) by mouth 2 (two) times daily.      . metoprolol succinate (TOPROL-XL) 25 MG 24 hr tablet Take 12.5 mg by mouth daily. Start once your current supply of lopressor is finished      . simvastatin (ZOCOR) 40 MG tablet Take 20 mg by mouth at bedtime.        Marland Kitchen warfarin (COUMADIN) 5 MG tablet Take 5 mg by mouth as directed. Managed by Dr. Sherril Croon        Past Medical History  Diagnosis Date  . Renal cell carcinoma   . Chronic kidney disease   . Prostate cancer     XRT 2006  . Strabismus   . TIA (transient ischemic attack)   . Carotid artery disease   . Mixed hyperlipidemia   . Essential hypertension, benign   . Atrial fibrillation   . Stroke      Right MCA  . Type 2 diabetes mellitus   . Seizure disorder     Social History Mr. Palladino reports that he quit smoking about 26 years ago. His smoking use included Cigarettes. He has a 80 pack-year smoking history. He has never used smokeless tobacco. Mr. Amores reports that he does not drink alcohol.  Review of Systems Negative except as outlined.  Physical Examination Filed Vitals:   02/06/12 1419  BP: 150/81  Pulse: 57    Normally nourished appearing male no acute distress.  HEENT: Conjunctiva and lids normal, oropharynx clear.  Neck: Supple, no elevated JVP or bruits.  Lungs: Clear auscultation, nonlabored.  Cardiac: Irregularly irregular, no S3.  Abdomen: Soft, nontender, bowel sounds present.  Skin: Warm and dry.  Musculoskeletal: No kyphosis.  Extremities: 1+ edema below the knees with some stasis, distal pulses one plus.  Neuropsychiatric: Alert and oriented x3, affect appropriate.   Problem List and Plan   ATRIAL FIBRILLATION Continue Coumadin. Will change Lopressor to Toprol-XL 25 mg, one half tablet daily. Otherwise continue observation.  ESSENTIAL HYPERTENSION, BENIGN Norvasc has been increased to 10 mg daily. Blood pressure trend is better based on review of home monitoring.  Dyspnea on exertion Chronic. Patient has evidence of mild left ventricular dysfunction, also probable underlying CAD with previous inferior infarct. He  is not reporting any angina. Would focus on medical therapy and observation for now.  CHRONIC KIDNEY DISEASE UNSPECIFIED Stage IV chronic kidney disease, creatinine 3.3 back in April. Referral to nephrology should be considered. Copies of prior BMET forwarded to Dr. Sherril Croon previously. Information given regarding Dr. Kristian Covey.     Jonelle Sidle, M.D., F.A.C.C.

## 2012-02-06 NOTE — Patient Instructions (Addendum)
Your physician recommends that you schedule a follow-up appointment in: 4 months with Dr. Diona Browner. You will receive a reminder letter in the mail in about 2 months reminding you to call and schedule your appointment. If you don't receive this letter, please contact our office.   Your physician has recommended you make the following change in your medication: finish your current supply of lopressor then start  Toprol XL 25 mg taking 1/2 tablet daily.   Dr. Bertha Stakes number is [phone] [address] [place], [place] [address]

## 2012-02-06 NOTE — Assessment & Plan Note (Signed)
Chronic. Patient has evidence of mild left ventricular dysfunction, also probable underlying CAD with previous inferior infarct. He is not reporting any angina. Would focus on medical therapy and observation for now.

## 2012-02-06 NOTE — Assessment & Plan Note (Signed)
Norvasc has been increased to 10 mg daily. Blood pressure trend is better based on review of home monitoring.

## 2012-02-06 NOTE — Assessment & Plan Note (Signed)
Stage IV chronic kidney disease, creatinine 3.3 back in April. Referral to nephrology should be considered. Copies of prior BMET forwarded to Dr. Sherril Croon previously. Information given regarding Dr. Kristian Covey.

## 2012-02-06 NOTE — Assessment & Plan Note (Signed)
Continue Coumadin. Will change Lopressor to Toprol-XL 25 mg, one half tablet daily. Otherwise continue observation.

## 2012-03-29 DEATH — deceased

## 2012-04-24 ENCOUNTER — Encounter: Payer: Medicare Other | Admitting: Internal Medicine

## 2012-04-24 DIAGNOSIS — D472 Monoclonal gammopathy: Secondary | ICD-10-CM

## 2012-04-24 DIAGNOSIS — N184 Chronic kidney disease, stage 4 (severe): Secondary | ICD-10-CM

## 2012-04-24 DIAGNOSIS — C649 Malignant neoplasm of unspecified kidney, except renal pelvis: Secondary | ICD-10-CM

## 2012-05-10 ENCOUNTER — Encounter (INDEPENDENT_AMBULATORY_CARE_PROVIDER_SITE_OTHER): Payer: Medicare Other | Admitting: Internal Medicine

## 2012-05-10 DIAGNOSIS — N184 Chronic kidney disease, stage 4 (severe): Secondary | ICD-10-CM

## 2012-05-10 DIAGNOSIS — I129 Hypertensive chronic kidney disease with stage 1 through stage 4 chronic kidney disease, or unspecified chronic kidney disease: Secondary | ICD-10-CM

## 2012-05-10 DIAGNOSIS — D472 Monoclonal gammopathy: Secondary | ICD-10-CM

## 2012-05-16 ENCOUNTER — Ambulatory Visit (INDEPENDENT_AMBULATORY_CARE_PROVIDER_SITE_OTHER): Payer: Medicare Other | Admitting: Vascular Surgery

## 2012-05-16 DIAGNOSIS — N189 Chronic kidney disease, unspecified: Secondary | ICD-10-CM

## 2012-05-16 DIAGNOSIS — Z85528 Personal history of other malignant neoplasm of kidney: Secondary | ICD-10-CM

## 2012-06-08 ENCOUNTER — Encounter: Payer: Self-pay | Admitting: Cardiology

## 2012-06-08 ENCOUNTER — Ambulatory Visit (INDEPENDENT_AMBULATORY_CARE_PROVIDER_SITE_OTHER): Payer: Medicare Other | Admitting: Cardiology

## 2012-06-08 VITALS — BP 121/77 | HR 58 | Ht 72.0 in | Wt 192.4 lb

## 2012-06-08 DIAGNOSIS — E782 Mixed hyperlipidemia: Secondary | ICD-10-CM

## 2012-06-08 DIAGNOSIS — I10 Essential (primary) hypertension: Secondary | ICD-10-CM

## 2012-06-08 DIAGNOSIS — I4891 Unspecified atrial fibrillation: Secondary | ICD-10-CM

## 2012-06-08 NOTE — Progress Notes (Signed)
   Clinical Summary Devin Diaz is a 78 y.o.male presenting for followup. He was seen in June. He reports no problems with palpitations, no bleeding episodes on Coumadin. He is now followed by Nephrology in [place], states he will likely have a fistula done in the near future in preparation for hemodialysis ultimately.  He has had no chest pain or breathlessness over baseline. Using a cane to ambulate. Denies any falls. He tolerated the medication adjustments that we made at the last visit.   No Known Allergies  Current Outpatient Prescriptions  Medication Sig Dispense Refill  . amLODipine (NORVASC) 5 MG tablet Take 10 mg by mouth daily.       . calcitRIOL (ROCALTROL) 0.25 MCG capsule Take 0.25 mcg by mouth daily.       . Cholecalciferol (VITAMIN D-3) 1000 UNITS CAPS Take 1,000 Units by mouth daily.      . furosemide (LASIX) 20 MG tablet Take 20 mg by mouth 2 (two) times daily.       Marland Kitchen levETIRAcetam (KEPPRA) 500 MG tablet Take 500 mg by mouth every 12 (twelve) hours.        Marland Kitchen losartan (COZAAR) 100 MG tablet Take 100 mg by mouth daily.       . metoprolol succinate (TOPROL-XL) 25 MG 24 hr tablet Take 0.5 tablets (12.5 mg total) by mouth daily. Start once your current supply of lopressor is finished  15 tablet  3  . simvastatin (ZOCOR) 40 MG tablet Take 20 mg by mouth at bedtime.        Marland Kitchen warfarin (COUMADIN) 5 MG tablet Take 5 mg by mouth as directed. Managed by Dr. Sherril Croon        Past Medical History  Diagnosis Date  . Renal cell carcinoma   . Chronic kidney disease   . Prostate cancer     XRT 2006  . Strabismus   . TIA (transient ischemic attack)   . Carotid artery disease   . Mixed hyperlipidemia   . Essential hypertension, benign   . Atrial fibrillation   . Stroke     Right MCA  . Type 2 diabetes mellitus   . Seizure disorder     Social History Mr. Saez reports that he quit smoking about 26 years ago. His smoking use included Cigarettes. He has a 80 pack-year smoking  history. He has never used smokeless tobacco. Mr. Earnest reports that he does not drink alcohol.  Review of Systems No progressive palpitations, no orthopnea or PND. Stable appetite.  Physical Examination Filed Vitals:   06/08/12 1104  BP: 121/77  Pulse: 58   Filed Weights   06/08/12 1104  Weight: 192 lb 6.4 oz (87.272 kg)    HEENT: Conjunctiva and lids normal, oropharynx clear.  Neck: Supple, no elevated JVP or bruits.  Lungs: Clear auscultation, nonlabored.  Cardiac: Irregularly irregular, no S3.  Abdomen: Soft, nontender, bowel sounds present.  Skin: Warm and dry.  Musculoskeletal: No kyphosis.  Extremities: 1+ edema below the knees with some stasis, distal pulses one plus.  Neuropsychiatric: Alert and oriented x3, affect appropriate.   Problem List and Plan   ATRIAL FIBRILLATION Chronic, and stable symptomatically. He continues on Coumadin. He tolerated the change Toprol-XL. Continue observation.  ESSENTIAL HYPERTENSION, BENIGN Blood pressure control looks good today.  MIXED HYPERLIPIDEMIA Continue on statin therapy.    Jonelle Sidle, M.D., F.A.C.C.

## 2012-06-08 NOTE — Assessment & Plan Note (Signed)
Chronic, and stable symptomatically. He continues on Coumadin. He tolerated the change Toprol-XL. Continue observation.

## 2012-06-08 NOTE — Assessment & Plan Note (Signed)
Blood pressure control looks good today. 

## 2012-06-08 NOTE — Patient Instructions (Addendum)

## 2012-06-08 NOTE — Assessment & Plan Note (Signed)
Continue on statin therapy. 

## 2012-06-29 DEATH — deceased

## 2012-11-15 DIAGNOSIS — D472 Monoclonal gammopathy: Secondary | ICD-10-CM

## 2012-12-05 ENCOUNTER — Ambulatory Visit (INDEPENDENT_AMBULATORY_CARE_PROVIDER_SITE_OTHER): Payer: Medicare Other | Admitting: Cardiology

## 2012-12-05 ENCOUNTER — Encounter: Payer: Self-pay | Admitting: Cardiology

## 2012-12-05 VITALS — BP 148/84 | HR 59 | Ht 72.0 in | Wt 203.0 lb

## 2012-12-05 DIAGNOSIS — I4891 Unspecified atrial fibrillation: Secondary | ICD-10-CM

## 2012-12-05 DIAGNOSIS — E782 Mixed hyperlipidemia: Secondary | ICD-10-CM

## 2012-12-05 NOTE — Assessment & Plan Note (Signed)
Heart rate well controlled. Continue current regimen including Coumadin.

## 2012-12-05 NOTE — Assessment & Plan Note (Signed)
Followup with Dr. Sherril Croon. He continues on Zocor.

## 2012-12-05 NOTE — Patient Instructions (Addendum)
Your physician recommends that you schedule a follow-up appointment in: 6 months. You will receive a reminder letter in the mail in about months reminding you to call and schedule your appointment. If you don't receive this letter, please contact our office. Your physician recommends that you continue on your current medications as directed. Please refer to the Current Medication list given to you today. 

## 2012-12-05 NOTE — Progress Notes (Signed)
   Clinical Summary Devin Diaz is an 79 y.o.male last seen in October 2013. He is here with his wife. He reports no problems with palpitations or chest pain. Indicates compliance with medications, no major bleeding problems with Coumadin.  Echocardiogram from May 2013 showed LVEF of 45-50% with diastolic dysfunction, inferior apical akinesis, mild left atrial enlargement, no significant degree of pulmonary hypertension. We are managing him medically.   No Known Allergies  Current Outpatient Prescriptions  Medication Sig Dispense Refill  . amLODipine (NORVASC) 5 MG tablet Take 10 mg by mouth daily.       . calcitRIOL (ROCALTROL) 0.25 MCG capsule Take 0.25 mcg by mouth daily.       . Cholecalciferol (VITAMIN D-3) 1000 UNITS CAPS Take 1,000 Units by mouth daily.      . furosemide (LASIX) 20 MG tablet Take 60 mg by mouth 2 (two) times daily.       Marland Kitchen levETIRAcetam (KEPPRA) 500 MG tablet Take 500 mg by mouth every 12 (twelve) hours.        Marland Kitchen losartan (COZAAR) 100 MG tablet Take 100 mg by mouth daily.       . metoprolol succinate (TOPROL-XL) 25 MG 24 hr tablet Take 0.5 tablets (12.5 mg total) by mouth daily. Start once your current supply of lopressor is finished  15 tablet  3  . sevelamer carbonate (RENVELA) 800 MG tablet Take 800 mg by mouth 3 (three) times daily with meals.      . simvastatin (ZOCOR) 40 MG tablet Take 20 mg by mouth at bedtime.        Marland Kitchen warfarin (COUMADIN) 5 MG tablet Take 5 mg by mouth as directed. Managed by Dr. Sherril Croon       No current facility-administered medications for this visit.    Past Medical History  Diagnosis Date  . Renal cell carcinoma   . Chronic kidney disease   . Prostate cancer     XRT 2006  . Strabismus   . TIA (transient ischemic attack)   . Carotid artery disease   . Mixed hyperlipidemia   . Essential hypertension, benign   . Atrial fibrillation   . Stroke     Right MCA  . Type 2 diabetes mellitus   . Seizure disorder     Social History Mr.  Diaz reports that he quit smoking about 27 years ago. His smoking use included Cigarettes. He has a 80 pack-year smoking history. He has never used smokeless tobacco. Devin Diaz reports that he does not drink alcohol.  Review of Systems Reports having a thyroid biopsy done in the interim, seen at [facility]. Continues to follow with Dr. Kristian Covey with CKD. Otherwise negative.  Physical Examination Filed Vitals:   12/05/12 1315  BP: 148/84  Pulse: 59   Filed Weights   12/05/12 1315  Weight: 203 lb (92.08 kg)    HEENT: Conjunctiva and lids normal, oropharynx clear.  Neck: Supple, no elevated JVP or bruits.  Lungs: Clear auscultation, nonlabored.  Cardiac: Irregularly irregular, no S3.  Skin: Warm and dry.  Musculoskeletal: No kyphosis.  Extremities: 1+ edema below the knees with some stasis, distal pulses one plus.     Problem List and Plan   ATRIAL FIBRILLATION Heart rate well controlled. Continue current regimen including Coumadin.  MIXED HYPERLIPIDEMIA Followup with Dr. Sherril Croon. He continues on Zocor.    Jonelle Sidle, M.D., F.A.C.C.

## 2013-04-23 ENCOUNTER — Encounter: Payer: Self-pay | Admitting: Cardiology

## 2013-05-13 ENCOUNTER — Other Ambulatory Visit: Payer: Self-pay | Admitting: Cardiology

## 2013-05-13 MED ORDER — METOPROLOL SUCCINATE ER 25 MG PO TB24: 12.5000 mg | ORAL_TABLET | Freq: Every day | ORAL | Status: DC

## 2013-05-20 ENCOUNTER — Encounter (INDEPENDENT_AMBULATORY_CARE_PROVIDER_SITE_OTHER): Payer: Medicare Other

## 2013-05-20 ENCOUNTER — Other Ambulatory Visit (HOSPITAL_COMMUNITY): Payer: Self-pay | Admitting: Hematology and Oncology

## 2013-05-20 DIAGNOSIS — M899 Disorder of bone, unspecified: Secondary | ICD-10-CM

## 2013-05-20 DIAGNOSIS — M949 Disorder of cartilage, unspecified: Secondary | ICD-10-CM

## 2013-05-20 DIAGNOSIS — R911 Solitary pulmonary nodule: Secondary | ICD-10-CM

## 2013-05-20 DIAGNOSIS — C61 Malignant neoplasm of prostate: Secondary | ICD-10-CM

## 2013-05-20 DIAGNOSIS — C649 Malignant neoplasm of unspecified kidney, except renal pelvis: Secondary | ICD-10-CM

## 2013-05-20 DIAGNOSIS — D472 Monoclonal gammopathy: Secondary | ICD-10-CM

## 2013-05-30 ENCOUNTER — Encounter (HOSPITAL_COMMUNITY): Payer: Self-pay

## 2013-05-30 ENCOUNTER — Encounter (HOSPITAL_COMMUNITY)
Admission: RE | Admit: 2013-05-30 | Discharge: 2013-05-30 | Disposition: A | Discharge status: Home / Self Care | Payer: Medicare Other | Source: Ambulatory Visit | Attending: Hematology and Oncology | Admitting: Hematology and Oncology

## 2013-05-30 DIAGNOSIS — C61 Malignant neoplasm of prostate: Secondary | ICD-10-CM | POA: Insufficient documentation

## 2013-05-30 LAB — GLUCOSE, CAPILLARY: Glucose-Capillary: 96 mg/dL (ref 70–99)

## 2013-05-30 MED ORDER — FLUDEOXYGLUCOSE F - 18 (FDG) INJECTION
18.9000 | Freq: Once | INTRAVENOUS | Status: AC | PRN
  Administered 2013-05-30: 18.9 via INTRAVENOUS

## 2013-05-31 ENCOUNTER — Encounter (INDEPENDENT_AMBULATORY_CARE_PROVIDER_SITE_OTHER): Payer: Medicare Other

## 2013-05-31 DIAGNOSIS — E8809 Other disorders of plasma-protein metabolism, not elsewhere classified: Secondary | ICD-10-CM

## 2013-05-31 DIAGNOSIS — E079 Disorder of thyroid, unspecified: Secondary | ICD-10-CM

## 2013-05-31 DIAGNOSIS — M899 Disorder of bone, unspecified: Secondary | ICD-10-CM

## 2013-05-31 DIAGNOSIS — C61 Malignant neoplasm of prostate: Secondary | ICD-10-CM

## 2013-05-31 DIAGNOSIS — Z85528 Personal history of other malignant neoplasm of kidney: Secondary | ICD-10-CM

## 2013-05-31 DIAGNOSIS — R944 Abnormal results of kidney function studies: Secondary | ICD-10-CM

## 2013-05-31 DIAGNOSIS — N289 Disorder of kidney and ureter, unspecified: Secondary | ICD-10-CM

## 2013-05-31 DIAGNOSIS — M949 Disorder of cartilage, unspecified: Secondary | ICD-10-CM

## 2013-06-05 ENCOUNTER — Ambulatory Visit (INDEPENDENT_AMBULATORY_CARE_PROVIDER_SITE_OTHER): Payer: Medicare Other | Admitting: Cardiology

## 2013-06-05 ENCOUNTER — Encounter: Payer: Self-pay | Admitting: Cardiology

## 2013-06-05 VITALS — BP 117/80 | HR 75 | Ht 72.0 in | Wt 194.1 lb

## 2013-06-05 DIAGNOSIS — I4891 Unspecified atrial fibrillation: Secondary | ICD-10-CM

## 2013-06-05 DIAGNOSIS — I10 Essential (primary) hypertension: Secondary | ICD-10-CM

## 2013-06-05 NOTE — Patient Instructions (Signed)

## 2013-06-05 NOTE — Progress Notes (Signed)
Clinical Summary Mr. Devin Diaz is an 79 y.o.male last seen in April. It he is here with his wife. Cardiac perspective, no new complaints. He remains in atrial fibrillation, no significant palpitations.  I see that he has been undergoing further oncology evaluation, had a recent PET scan demonstrating abnormalities within the right trapezius, no recurrence within the left nephrectomy bed, also a probable mild benign right kidney lesion, and a hypermetabolic area within the right lobe of thyroid. He is off Coumadin in preparation for a biopsy of his right trapezius tomorrow.  Echocardiogram from May 2013 showed LVEF of 45-50% with diastolic dysfunction, inferior apical akinesis, mild left atrial enlargement, no significant degree of pulmonary hypertension.   No Known Allergies  Current Outpatient Prescriptions  Medication Sig Dispense Refill  . amLODipine (NORVASC) 5 MG tablet Take 10 mg by mouth daily.       . calcitRIOL (ROCALTROL) 0.25 MCG capsule Take 0.25 mcg by mouth daily.       . Cholecalciferol (VITAMIN D-3) 1000 UNITS CAPS Take 1,000 Units by mouth daily.      . furosemide (LASIX) 20 MG tablet Take 60 mg by mouth 2 (two) times daily.       Marland Kitchen levETIRAcetam (KEPPRA) 500 MG tablet Take 500 mg by mouth every 12 (twelve) hours.        Marland Kitchen losartan (COZAAR) 100 MG tablet Take 100 mg by mouth daily.       . metoprolol succinate (TOPROL-XL) 25 MG 24 hr tablet Take 0.5 tablets (12.5 mg total) by mouth daily. Start once your current supply of lopressor is finished  15 tablet  3  . sevelamer carbonate (RENVELA) 800 MG tablet Take 800 mg by mouth 3 (three) times daily with meals.      . simvastatin (ZOCOR) 40 MG tablet Take 20 mg by mouth at bedtime.        Marland Kitchen warfarin (COUMADIN) 5 MG tablet Take 5 mg by mouth as directed. Managed by Dr. Sherril Croon       No current facility-administered medications for this visit.    Past Medical History  Diagnosis Date  . Renal cell carcinoma   . Chronic kidney  disease   . Prostate cancer     XRT 2006  . Strabismus   . TIA (transient ischemic attack)   . Carotid artery disease   . Mixed hyperlipidemia   . Essential hypertension, benign   . Atrial fibrillation   . Stroke     Right MCA  . Type 2 diabetes mellitus   . Seizure disorder     Social History Mr. Borre reports that he quit smoking about 27 years ago. His smoking use included Cigarettes. He has a 80 pack-year smoking history. He has never used smokeless tobacco. Mr. Vancott reports that he does not drink alcohol.  Review of Systems Stable appetite. No nausea. No reported bleeding episodes. Otherwise negative.  Physical Examination Filed Vitals:   06/05/13 0955  BP: 117/80  Pulse: 75   Filed Weights   06/05/13 0955  Weight: 194 lb 1.9 oz (88.052 kg)   Patient appears comfortable. HEENT: Conjunctiva and lids normal, oropharynx clear.  Neck: Supple, no elevated JVP or bruits.  Lungs: Clear auscultation, nonlabored.  Cardiac: Irregularly irregular, no S3.  Skin: Warm and dry.  Musculoskeletal: No kyphosis.  Extremities: 1+ edema below the knees with some stasis, distal pulses one plus.    Problem List and Plan   Atrial fibrillation Symptomatically stable. Continue current strategy  of heart rate control and anticoagulation.  Essential hypertension, benign Blood pressure is normal today. No changes made.    Jonelle Sidle, M.D., F.A.C.C.

## 2013-06-05 NOTE — Assessment & Plan Note (Signed)
Blood pressure is normal today. No changes made. 

## 2013-06-05 NOTE — Assessment & Plan Note (Signed)
Symptomatically stable. Continue current strategy of heart rate control and anticoagulation.

## 2013-06-13 DIAGNOSIS — C50919 Malignant neoplasm of unspecified site of unspecified female breast: Secondary | ICD-10-CM

## 2013-06-13 DIAGNOSIS — M949 Disorder of cartilage, unspecified: Secondary | ICD-10-CM

## 2013-06-13 DIAGNOSIS — Z7901 Long term (current) use of anticoagulants: Secondary | ICD-10-CM

## 2013-06-13 DIAGNOSIS — M899 Disorder of bone, unspecified: Secondary | ICD-10-CM

## 2013-06-13 DIAGNOSIS — C61 Malignant neoplasm of prostate: Secondary | ICD-10-CM

## 2013-06-13 DIAGNOSIS — N289 Disorder of kidney and ureter, unspecified: Secondary | ICD-10-CM

## 2013-06-13 DIAGNOSIS — E8809 Other disorders of plasma-protein metabolism, not elsewhere classified: Secondary | ICD-10-CM

## 2013-06-13 DIAGNOSIS — Z85528 Personal history of other malignant neoplasm of kidney: Secondary | ICD-10-CM

## 2013-06-13 DIAGNOSIS — C779 Secondary and unspecified malignant neoplasm of lymph node, unspecified: Secondary | ICD-10-CM

## 2013-07-11 ENCOUNTER — Encounter (INDEPENDENT_AMBULATORY_CARE_PROVIDER_SITE_OTHER): Payer: Medicare Other

## 2013-07-11 DIAGNOSIS — N189 Chronic kidney disease, unspecified: Secondary | ICD-10-CM

## 2013-07-11 DIAGNOSIS — C61 Malignant neoplasm of prostate: Secondary | ICD-10-CM

## 2013-07-11 DIAGNOSIS — C649 Malignant neoplasm of unspecified kidney, except renal pelvis: Secondary | ICD-10-CM

## 2013-07-22 ENCOUNTER — Encounter (INDEPENDENT_AMBULATORY_CARE_PROVIDER_SITE_OTHER): Payer: Medicare Other

## 2013-07-22 DIAGNOSIS — R109 Unspecified abdominal pain: Secondary | ICD-10-CM

## 2013-07-22 DIAGNOSIS — E2749 Other adrenocortical insufficiency: Secondary | ICD-10-CM

## 2013-07-22 DIAGNOSIS — N189 Chronic kidney disease, unspecified: Secondary | ICD-10-CM

## 2013-07-22 DIAGNOSIS — R944 Abnormal results of kidney function studies: Secondary | ICD-10-CM

## 2013-07-22 DIAGNOSIS — C7970 Secondary malignant neoplasm of unspecified adrenal gland: Secondary | ICD-10-CM

## 2013-07-22 DIAGNOSIS — C649 Malignant neoplasm of unspecified kidney, except renal pelvis: Secondary | ICD-10-CM

## 2013-07-29 DIAGNOSIS — C649 Malignant neoplasm of unspecified kidney, except renal pelvis: Secondary | ICD-10-CM

## 2013-07-29 DIAGNOSIS — K859 Acute pancreatitis without necrosis or infection, unspecified: Secondary | ICD-10-CM

## 2013-07-29 DIAGNOSIS — C7970 Secondary malignant neoplasm of unspecified adrenal gland: Secondary | ICD-10-CM

## 2013-07-29 DIAGNOSIS — E2749 Other adrenocortical insufficiency: Secondary | ICD-10-CM

## 2013-08-08 ENCOUNTER — Encounter (INDEPENDENT_AMBULATORY_CARE_PROVIDER_SITE_OTHER): Payer: Medicare Other

## 2013-08-08 DIAGNOSIS — N189 Chronic kidney disease, unspecified: Secondary | ICD-10-CM

## 2013-08-08 DIAGNOSIS — R569 Unspecified convulsions: Secondary | ICD-10-CM

## 2013-08-08 DIAGNOSIS — C679 Malignant neoplasm of bladder, unspecified: Secondary | ICD-10-CM

## 2013-08-08 DIAGNOSIS — I959 Hypotension, unspecified: Secondary | ICD-10-CM

## 2013-08-15 ENCOUNTER — Other Ambulatory Visit (HOSPITAL_COMMUNITY): Payer: Self-pay | Admitting: Nephrology

## 2013-08-15 DIAGNOSIS — N186 End stage renal disease: Secondary | ICD-10-CM

## 2013-08-16 ENCOUNTER — Other Ambulatory Visit (HOSPITAL_COMMUNITY): Payer: Self-pay | Admitting: Nephrology

## 2013-08-16 ENCOUNTER — Encounter (HOSPITAL_COMMUNITY): Payer: Self-pay

## 2013-08-16 ENCOUNTER — Ambulatory Visit (HOSPITAL_COMMUNITY)
Admission: RE | Admit: 2013-08-16 | Discharge: 2013-08-16 | Disposition: A | Discharge status: Home / Self Care | Payer: Medicare Other | Source: Ambulatory Visit | Attending: Nephrology | Admitting: Nephrology

## 2013-08-16 VITALS — BP 147/104 | HR 89 | Temp 98.6°F | Resp 16

## 2013-08-16 DIAGNOSIS — T82898A Other specified complication of vascular prosthetic devices, implants and grafts, initial encounter: Secondary | ICD-10-CM | POA: Insufficient documentation

## 2013-08-16 DIAGNOSIS — Z992 Dependence on renal dialysis: Secondary | ICD-10-CM | POA: Insufficient documentation

## 2013-08-16 DIAGNOSIS — N186 End stage renal disease: Secondary | ICD-10-CM | POA: Insufficient documentation

## 2013-08-16 DIAGNOSIS — Y841 Kidney dialysis as the cause of abnormal reaction of the patient, or of later complication, without mention of misadventure at the time of the procedure: Secondary | ICD-10-CM | POA: Insufficient documentation

## 2013-08-16 LAB — CBC
HCT: 29.4 % — ABNORMAL LOW (ref 39.0–52.0)
MCHC: 34 g/dL (ref 30.0–36.0)
Platelets: 109 10*3/uL — ABNORMAL LOW (ref 150–400)
RDW: 14.6 % (ref 11.5–15.5)
WBC: 3.6 10*3/uL — ABNORMAL LOW (ref 4.0–10.5)

## 2013-08-16 LAB — PROTIME-INR: Prothrombin Time: 13.4 seconds (ref 11.6–15.2)

## 2013-08-16 LAB — APTT: aPTT: 31 seconds (ref 24–37)

## 2013-08-16 MED ORDER — CEFAZOLIN SODIUM-DEXTROSE 2-3 GM-% IV SOLR
2.0000 g | INTRAVENOUS | Status: DC
  Filled 2013-08-16: qty 50

## 2013-08-16 MED ORDER — FENTANYL CITRATE 0.05 MG/ML IJ SOLN
INTRAMUSCULAR | Status: AC | PRN
  Administered 2013-08-16: 25 ug via INTRAVENOUS

## 2013-08-16 MED ORDER — MIDAZOLAM HCL 2 MG/2ML IJ SOLN
INTRAMUSCULAR | Status: AC
  Filled 2013-08-16: qty 2

## 2013-08-16 MED ORDER — FENTANYL CITRATE 0.05 MG/ML IJ SOLN
INTRAMUSCULAR | Status: AC
  Filled 2013-08-16: qty 2

## 2013-08-16 MED ORDER — CEFAZOLIN SODIUM 1-5 GM-% IV SOLN
INTRAVENOUS | Status: AC
  Administered 2013-08-16: 2000 mg
  Filled 2013-08-16: qty 50

## 2013-08-16 MED ORDER — CHLORHEXIDINE GLUCONATE 4 % EX LIQD
CUTANEOUS | Status: AC
  Filled 2013-08-16: qty 30

## 2013-08-16 MED ORDER — HEPARIN SODIUM (PORCINE) 1000 UNIT/ML IJ SOLN
INTRAMUSCULAR | Status: AC
  Filled 2013-08-16: qty 1

## 2013-08-16 MED ORDER — MIDAZOLAM HCL 2 MG/2ML IJ SOLN
INTRAMUSCULAR | Status: AC | PRN
  Administered 2013-08-16: 0.5 mg via INTRAVENOUS

## 2013-08-16 MED ORDER — SODIUM CHLORIDE 0.9 % IV SOLN: Freq: Once | INTRAVENOUS | Status: DC

## 2013-08-16 NOTE — Procedures (Signed)
Interventional Radiology Procedure Note  Procedure: 1. Removal of non functioning right IJ catheter 2. Placement of a new right IJ tunneled HD catheter.  Tip in upper RA and ready for use. Complications: None Recommendations: - Routine line care  Signed,  Sterling Big, MD [facility]

## 2013-09-06 ENCOUNTER — Other Ambulatory Visit: Payer: Self-pay | Admitting: *Deleted

## 2013-09-06 DIAGNOSIS — N186 End stage renal disease: Secondary | ICD-10-CM

## 2013-09-06 DIAGNOSIS — Z01810 Encounter for preprocedural cardiovascular examination: Secondary | ICD-10-CM

## 2013-09-27 ENCOUNTER — Encounter: Payer: Self-pay | Admitting: Surgery

## 2013-09-30 ENCOUNTER — Ambulatory Visit (INDEPENDENT_AMBULATORY_CARE_PROVIDER_SITE_OTHER): Payer: Medicare Other | Admitting: Surgery

## 2013-09-30 ENCOUNTER — Ambulatory Visit (INDEPENDENT_AMBULATORY_CARE_PROVIDER_SITE_OTHER)
Admission: RE | Admit: 2013-09-30 | Discharge: 2013-09-30 | Disposition: A | Discharge status: Home / Self Care | Payer: Medicare Other | Source: Ambulatory Visit | Attending: Surgery | Admitting: Surgery

## 2013-09-30 ENCOUNTER — Encounter: Payer: Self-pay | Admitting: Surgery

## 2013-09-30 ENCOUNTER — Ambulatory Visit (HOSPITAL_COMMUNITY)
Admission: RE | Admit: 2013-09-30 | Discharge: 2013-09-30 | Disposition: A | Discharge status: Home / Self Care | Payer: Medicare Other | Source: Ambulatory Visit | Attending: Surgery | Admitting: Surgery

## 2013-09-30 VITALS — BP 151/107 | HR 59 | Resp 18 | Ht 72.0 in | Wt 180.5 lb

## 2013-09-30 DIAGNOSIS — N186 End stage renal disease: Secondary | ICD-10-CM

## 2013-09-30 DIAGNOSIS — Z01810 Encounter for preprocedural cardiovascular examination: Secondary | ICD-10-CM

## 2013-09-30 NOTE — Progress Notes (Signed)
Patient name: Devin Diaz MRN: [id number] DOB: 1930-01-12 Sex: male   Referred by: Dr. Lowanda Foster  Reason for referral:  Chief Complaint  Patient presents with  . Follow-up    evaluation for AVF placement   referred by Dr. Lowanda Foster    HISTORY OF PRESENT ILLNESS: This is an 80 year old gentleman who was referred for evaluation of permanent dialysis access.  The patient is currently on dialysis Tuesday Thursday Saturday via a right-sided catheter.  He is right-handed.  The patient has a history of renal cell carcinoma and is status post nephrectomy.  He is on an ACE inhibitor for his hypertension.  The patient is managed with a statin for hypercholesterolemia.  The patient has a history of atrial fibrillation and is on chronic Coumadin.  He is thought to have had an embolic stroke to the right MCA in 2011.  Past Medical History  Diagnosis Date  . Renal cell carcinoma   . Chronic kidney disease   . Prostate cancer     XRT 2006  . Strabismus   . TIA (transient ischemic attack)   . Carotid artery disease   . Mixed hyperlipidemia   . Essential hypertension, benign   . Atrial fibrillation   . Stroke     Right MCA  . Type 2 diabetes mellitus   . Seizure disorder   . COPD (chronic obstructive pulmonary disease)   . Atrial fibrillation     Past Surgical History  Procedure Laterality Date  . Left nephrectomy    . Tonsillectomy  1958  . Prostate surgery    . Repair of slipped disc  1975  . Hernia repair  2010    History   Social History  . Marital Status: Married    Spouse Name: N/A    Number of Children: N/A  . Years of Education: N/A   Occupational History  . Retired     [employer] work   Social History Main Topics  . Smoking status: Former Smoker -- 2.00 packs/day for 40 years    Types: Cigarettes    Quit date: 08/29/1985  . Smokeless tobacco: Never Used  . Alcohol Use: No  . Drug Use: No  . Sexual Activity: Not on file   Other Topics Concern  . Not on  file   Social History Narrative  . No narrative on file    Family History  Problem Relation Age of Onset  . Heart disease Mother   . Heart disease Father   . Cancer Sister   . Hypertension Sister   . Cancer Brother   . Hypertension Brother     Allergies as of 09/30/2013  . (No Known Allergies)    Current Outpatient Prescriptions on File Prior to Visit  Medication Sig Dispense Refill  . levETIRAcetam (KEPPRA) 500 MG tablet Take 500 mg by mouth every 12 (twelve) hours.        . metoprolol succinate (TOPROL-XL) 25 MG 24 hr tablet Take 0.5 tablets (12.5 mg total) by mouth daily. Start once your current supply of lopressor is finished  15 tablet  3  . simvastatin (ZOCOR) 40 MG tablet Take 20 mg by mouth at bedtime.        Marland Kitchen warfarin (COUMADIN) 5 MG tablet Take 5 mg by mouth as directed. Managed by Dr. Woody Seller      . amLODipine (NORVASC) 5 MG tablet Take 10 mg by mouth daily.       . calcitRIOL (ROCALTROL) 0.25 MCG capsule  Take 0.25 mcg by mouth daily.       . Cholecalciferol (VITAMIN D-3) 1000 UNITS CAPS Take 1,000 Units by mouth daily.      . furosemide (LASIX) 20 MG tablet Take 60 mg by mouth 2 (two) times daily.       Marland Kitchen losartan (COZAAR) 100 MG tablet Take 100 mg by mouth daily.       . sevelamer carbonate (RENVELA) 800 MG tablet Take 800 mg by mouth 3 (three) times daily with meals.       No current facility-administered medications on file prior to visit.     REVIEW OF SYSTEMS: Cardiovascular: No chest pain, chest pressure, palpitations, orthopnea, or dyspnea on exertion. No claudication or rest pain,  No history of DVT or phlebitis. Pulmonary: No productive cough, asthma or wheezing. Neurologic: No weakness, paresthesias, aphasia, or amaurosis. No dizziness. Hematologic: No bleeding problems or clotting disorders. Musculoskeletal: No joint pain or joint swelling. Gastrointestinal: No blood in stool or hematemesis Genitourinary: No dysuria or hematuria. Psychiatric:: No  history of major depression. Integumentary: No rashes or ulcers. Constitutional: No fever or chills.  PHYSICAL EXAMINATION: General: The patient appears their stated age.  Vital signs are BP 151/107  Pulse 59  Resp 18  Ht 6' (1.829 m)  Wt 180 lb 8 oz (81.874 kg)  BMI 24.47 kg/m2 HEENT:  No gross abnormalities Pulmonary: Respirations are non-labored Musculoskeletal: There are no major deformities.   Neurologic: No focal weakness or paresthesias are detected, Skin: There are no ulcer or rashes noted. Psychiatric: The patient has normal affect. Cardiovascular: There is a regular rate and rhythm without significant murmur appreciated.  Palpable left brachial and radial pulse  Diagnostic Studies: Vein mapping was ordered and reviewed.  The patient has an adequate left cephalic vein measuring 0.3 cm throughout the arm.  He did have an abnormal Allen's test.  He has a high takeoff of the radial artery bilaterally    Assessment:  End-stage renal disease Plan: The patient will be scheduled for a left radiocephalic, possible brachiocephalic fistula on Friday, February 20.  He will need to be off of his Coumadin for 5 days prior to his operation.  I discussed the risks and benefits of the operation including the risk of steal syndrome as well as the need for future interventions.  All the patient's questions were answered today.     Eldridge Abrahams, M.D. [facility] [place] Office: [phone] Pager:  [phone]

## 2013-10-07 ENCOUNTER — Telehealth: Payer: Self-pay

## 2013-10-07 NOTE — Telephone Encounter (Signed)
Phone call placed to Dr. Woody Seller' office; spoke with Otila Kluver, nurse for Dr. Woody Seller.  Informed Otila Kluver that pt. is scheduled for Creation of Left arm AVF on 10/18/13.  We are advising pt. to hold Coumadin 5 days prior to surgery; last dose should be 10/12/13.  Requesting approval from Dr. Woody Seller to hold Coumadin pre-procedure.  Also advise if pt. needs a Lovenox bridge; if needed Dr. Trula Slade is requesting that Dr. Woody Seller order the Lovenox.  Faxed this request to Dr. Woody Seller @ [phone].

## 2013-10-11 ENCOUNTER — Encounter (HOSPITAL_COMMUNITY): Payer: Self-pay | Admitting: Pharmacist

## 2013-10-16 ENCOUNTER — Other Ambulatory Visit: Payer: Self-pay

## 2013-10-16 ENCOUNTER — Encounter (HOSPITAL_COMMUNITY): Payer: Self-pay | Admitting: *Deleted

## 2013-10-16 NOTE — Telephone Encounter (Signed)
Rec'd return call from "Devin Diaz" @ Dr. Woody Seller' office.  Reported that pt's INR today was 1.3.  Dr. Woody Seller recommended Lovenox bridge, but pt. Declined taking Lovenox.  Per Dr. Woody Seller, pt. Should resume Coumadin 5 mg. Daily, the day following surgery, and to return to their office on 2/25 for a repeat INR.

## 2013-10-17 MED ORDER — DEXTROSE 5 % IV SOLN
1.5000 g | INTRAVENOUS | Status: AC
  Administered 2013-10-18: 1.5 g via INTRAVENOUS
  Filled 2013-10-17: qty 1.5

## 2013-10-18 ENCOUNTER — Ambulatory Visit (HOSPITAL_COMMUNITY): Payer: Medicare Other | Admitting: Certified Registered Nurse Anesthetist

## 2013-10-18 ENCOUNTER — Ambulatory Visit (HOSPITAL_COMMUNITY)
Admission: RE | Admit: 2013-10-18 | Discharge: 2013-10-18 | Disposition: A | Discharge status: Home / Self Care | Payer: Medicare Other | Source: Ambulatory Visit | Attending: Surgery | Admitting: Surgery

## 2013-10-18 ENCOUNTER — Encounter (HOSPITAL_COMMUNITY)
Admission: RE | Disposition: A | Discharge status: Home / Self Care | Payer: Self-pay | Source: Ambulatory Visit | Attending: Surgery

## 2013-10-18 ENCOUNTER — Encounter (HOSPITAL_COMMUNITY): Payer: Medicare Other | Admitting: Certified Registered Nurse Anesthetist

## 2013-10-18 ENCOUNTER — Ambulatory Visit (HOSPITAL_COMMUNITY): Payer: Medicare Other

## 2013-10-18 ENCOUNTER — Encounter (HOSPITAL_COMMUNITY): Payer: Self-pay | Admitting: Certified Registered Nurse Anesthetist

## 2013-10-18 DIAGNOSIS — N186 End stage renal disease: Secondary | ICD-10-CM | POA: Insufficient documentation

## 2013-10-18 DIAGNOSIS — Z87891 Personal history of nicotine dependence: Secondary | ICD-10-CM | POA: Insufficient documentation

## 2013-10-18 DIAGNOSIS — Z8546 Personal history of malignant neoplasm of prostate: Secondary | ICD-10-CM | POA: Insufficient documentation

## 2013-10-18 DIAGNOSIS — G40909 Epilepsy, unspecified, not intractable, without status epilepticus: Secondary | ICD-10-CM | POA: Insufficient documentation

## 2013-10-18 DIAGNOSIS — Z905 Acquired absence of kidney: Secondary | ICD-10-CM | POA: Insufficient documentation

## 2013-10-18 DIAGNOSIS — E782 Mixed hyperlipidemia: Secondary | ICD-10-CM | POA: Insufficient documentation

## 2013-10-18 DIAGNOSIS — Z992 Dependence on renal dialysis: Secondary | ICD-10-CM | POA: Insufficient documentation

## 2013-10-18 DIAGNOSIS — Z923 Personal history of irradiation: Secondary | ICD-10-CM | POA: Insufficient documentation

## 2013-10-18 DIAGNOSIS — I4891 Unspecified atrial fibrillation: Secondary | ICD-10-CM | POA: Insufficient documentation

## 2013-10-18 DIAGNOSIS — J449 Chronic obstructive pulmonary disease, unspecified: Secondary | ICD-10-CM | POA: Insufficient documentation

## 2013-10-18 DIAGNOSIS — I779 Disorder of arteries and arterioles, unspecified: Secondary | ICD-10-CM | POA: Insufficient documentation

## 2013-10-18 DIAGNOSIS — Z85528 Personal history of other malignant neoplasm of kidney: Secondary | ICD-10-CM | POA: Insufficient documentation

## 2013-10-18 DIAGNOSIS — N184 Chronic kidney disease, stage 4 (severe): Secondary | ICD-10-CM

## 2013-10-18 DIAGNOSIS — Z7901 Long term (current) use of anticoagulants: Secondary | ICD-10-CM | POA: Insufficient documentation

## 2013-10-18 DIAGNOSIS — Z8673 Personal history of transient ischemic attack (TIA), and cerebral infarction without residual deficits: Secondary | ICD-10-CM | POA: Insufficient documentation

## 2013-10-18 DIAGNOSIS — E119 Type 2 diabetes mellitus without complications: Secondary | ICD-10-CM | POA: Insufficient documentation

## 2013-10-18 DIAGNOSIS — I120 Hypertensive chronic kidney disease with stage 5 chronic kidney disease or end stage renal disease: Secondary | ICD-10-CM | POA: Insufficient documentation

## 2013-10-18 DIAGNOSIS — I739 Peripheral vascular disease, unspecified: Secondary | ICD-10-CM | POA: Insufficient documentation

## 2013-10-18 DIAGNOSIS — J4489 Other specified chronic obstructive pulmonary disease: Secondary | ICD-10-CM | POA: Insufficient documentation

## 2013-10-18 HISTORY — DX: Nontoxic single thyroid nodule: E04.1

## 2013-10-18 HISTORY — PX: AV FISTULA PLACEMENT: SHX1204

## 2013-10-18 HISTORY — DX: Personal history of other medical treatment: Z92.89

## 2013-10-18 LAB — PROTIME-INR
INR: 1.11 (ref 0.00–1.49)
PROTHROMBIN TIME: 14.1 s (ref 11.6–15.2)

## 2013-10-18 LAB — APTT: aPTT: 29 seconds (ref 24–37)

## 2013-10-18 LAB — POCT I-STAT 4, (NA,K, GLUC, HGB,HCT)
Glucose, Bld: 103 mg/dL — ABNORMAL HIGH (ref 70–99)
HCT: 41 % (ref 39.0–52.0)
Hemoglobin: 13.9 g/dL (ref 13.0–17.0)
POTASSIUM: 3.6 meq/L — AB (ref 3.7–5.3)
Sodium: 140 mEq/L (ref 137–147)

## 2013-10-18 SURGERY — ARTERIOVENOUS (AV) FISTULA CREATION
Anesthesia: Monitor Anesthesia Care | Laterality: Left

## 2013-10-18 MED ORDER — PROPOFOL 10 MG/ML IV BOLUS
INTRAVENOUS | Status: AC
  Filled 2013-10-18: qty 20

## 2013-10-18 MED ORDER — MIDAZOLAM HCL 5 MG/5ML IJ SOLN
INTRAMUSCULAR | Status: DC | PRN
  Administered 2013-10-18 (×2): 1 mg via INTRAVENOUS

## 2013-10-18 MED ORDER — OXYCODONE HCL 5 MG PO TABS: 5.0000 mg | ORAL_TABLET | Freq: Once | ORAL | Status: DC | PRN

## 2013-10-18 MED ORDER — LIDOCAINE HCL (CARDIAC) 20 MG/ML IV SOLN
INTRAVENOUS | Status: AC
  Filled 2013-10-18: qty 5

## 2013-10-18 MED ORDER — FENTANYL CITRATE 0.05 MG/ML IJ SOLN
INTRAMUSCULAR | Status: AC
  Filled 2013-10-18: qty 5

## 2013-10-18 MED ORDER — MIDAZOLAM HCL 2 MG/2ML IJ SOLN
INTRAMUSCULAR | Status: AC
  Filled 2013-10-18: qty 2

## 2013-10-18 MED ORDER — ONDANSETRON HCL 4 MG/2ML IJ SOLN
INTRAMUSCULAR | Status: DC | PRN
  Administered 2013-10-18: 4 mg via INTRAVENOUS

## 2013-10-18 MED ORDER — PROTAMINE SULFATE 10 MG/ML IV SOLN
INTRAVENOUS | Status: DC | PRN
  Administered 2013-10-18: 25 mg via INTRAVENOUS

## 2013-10-18 MED ORDER — FENTANYL CITRATE 0.05 MG/ML IJ SOLN
INTRAMUSCULAR | Status: DC | PRN
  Administered 2013-10-18 (×2): 25 ug via INTRAVENOUS

## 2013-10-18 MED ORDER — PHENYLEPHRINE HCL 10 MG/ML IJ SOLN
10.0000 mg | INTRAVENOUS | Status: DC | PRN
  Administered 2013-10-18: 5 ug/min via INTRAVENOUS

## 2013-10-18 MED ORDER — MIDAZOLAM HCL 2 MG/2ML IJ SOLN: 1.0000 mg | INTRAMUSCULAR | Status: DC | PRN

## 2013-10-18 MED ORDER — HEPARIN SODIUM (PORCINE) 1000 UNIT/ML IJ SOLN
INTRAMUSCULAR | Status: DC | PRN
  Administered 2013-10-18: 3000 [IU] via INTRAVENOUS

## 2013-10-18 MED ORDER — OXYCODONE HCL 5 MG/5ML PO SOLN: 5.0000 mg | Freq: Once | ORAL | Status: DC | PRN

## 2013-10-18 MED ORDER — CHLORHEXIDINE GLUCONATE CLOTH 2 % EX PADS: 6.0000 | MEDICATED_PAD | Freq: Once | CUTANEOUS | Status: DC

## 2013-10-18 MED ORDER — FENTANYL CITRATE 0.05 MG/ML IJ SOLN: 25.0000 ug | INTRAMUSCULAR | Status: DC | PRN

## 2013-10-18 MED ORDER — LIDOCAINE-EPINEPHRINE (PF) 1 %-1:200000 IJ SOLN
INTRAMUSCULAR | Status: DC | PRN
  Administered 2013-10-18 (×2): 5 mL

## 2013-10-18 MED ORDER — 0.9 % SODIUM CHLORIDE (POUR BTL) OPTIME
TOPICAL | Status: DC | PRN
  Administered 2013-10-18: 1000 mL

## 2013-10-18 MED ORDER — SODIUM CHLORIDE 0.9 % IV SOLN
INTRAVENOUS | Status: DC | PRN
  Administered 2013-10-18 (×2): via INTRAVENOUS

## 2013-10-18 MED ORDER — OXYCODONE-ACETAMINOPHEN 5-325 MG PO TABS: 1.0000 | ORAL_TABLET | Freq: Four times a day (QID) | ORAL | Status: DC | PRN

## 2013-10-18 MED ORDER — SODIUM CHLORIDE 0.9 % IV SOLN: INTRAVENOUS | Status: DC

## 2013-10-18 MED ORDER — SODIUM CHLORIDE 0.9 % IV SOLN
INTRAVENOUS | Status: DC
  Administered 2013-10-18: 08:00:00 via INTRAVENOUS

## 2013-10-18 MED ORDER — PROPOFOL 10 MG/ML IV BOLUS
INTRAVENOUS | Status: DC | PRN
  Administered 2013-10-18 (×2): 20 mg via INTRAVENOUS
  Administered 2013-10-18: 100 mg via INTRAVENOUS

## 2013-10-18 MED ORDER — SODIUM CHLORIDE 0.9 % IR SOLN
Status: DC | PRN
  Administered 2013-10-18: 10:00:00

## 2013-10-18 MED ORDER — FENTANYL CITRATE 0.05 MG/ML IJ SOLN: 50.0000 ug | Freq: Once | INTRAMUSCULAR | Status: DC

## 2013-10-18 SURGICAL SUPPLY — 35 items
ARMBAND PINK RESTRICT EXTREMIT (MISCELLANEOUS) ×3 IMPLANT
CANISTER SUCTION 2500CC (MISCELLANEOUS) ×3 IMPLANT
CLIP TI MEDIUM 6 (CLIP) ×3 IMPLANT
CLIP TI WIDE RED SMALL 6 (CLIP) ×3 IMPLANT
COVER PROBE W GEL 5X96 (DRAPES) ×3 IMPLANT
COVER SURGICAL LIGHT HANDLE (MISCELLANEOUS) ×3 IMPLANT
DERMABOND ADVANCED (GAUZE/BANDAGES/DRESSINGS) ×2
DERMABOND ADVANCED .7 DNX12 (GAUZE/BANDAGES/DRESSINGS) ×1 IMPLANT
ELECT REM PT RETURN 9FT ADLT (ELECTROSURGICAL) ×3
ELECTRODE REM PT RTRN 9FT ADLT (ELECTROSURGICAL) ×1 IMPLANT
GLOVE BIO SURGEON STRL SZ 6.5 (GLOVE) ×2 IMPLANT
GLOVE BIO SURGEONS STRL SZ 6.5 (GLOVE) ×1
GLOVE BIOGEL PI IND STRL 6.5 (GLOVE) ×1 IMPLANT
GLOVE BIOGEL PI IND STRL 7.5 (GLOVE) ×2 IMPLANT
GLOVE BIOGEL PI INDICATOR 6.5 (GLOVE) ×2
GLOVE BIOGEL PI INDICATOR 7.5 (GLOVE) ×4
GLOVE SURG SS PI 7.5 STRL IVOR (GLOVE) ×3 IMPLANT
GOWN STRL REUS W/ TWL LRG LVL3 (GOWN DISPOSABLE) ×2 IMPLANT
GOWN STRL REUS W/ TWL XL LVL3 (GOWN DISPOSABLE) ×1 IMPLANT
GOWN STRL REUS W/TWL LRG LVL3 (GOWN DISPOSABLE) ×4
GOWN STRL REUS W/TWL XL LVL3 (GOWN DISPOSABLE) ×2
HEMOSTAT SNOW SURGICEL 2X4 (HEMOSTASIS) IMPLANT
KIT BASIN OR (CUSTOM PROCEDURE TRAY) ×3 IMPLANT
KIT ROOM TURNOVER OR (KITS) ×3 IMPLANT
NS IRRIG 1000ML POUR BTL (IV SOLUTION) ×3 IMPLANT
PACK CV ACCESS (CUSTOM PROCEDURE TRAY) ×3 IMPLANT
PAD ARMBOARD 7.5X6 YLW CONV (MISCELLANEOUS) ×6 IMPLANT
SUT PROLENE 6 0 CC (SUTURE) ×6 IMPLANT
SUT VIC AB 3-0 SH 27 (SUTURE) ×2
SUT VIC AB 3-0 SH 27X BRD (SUTURE) ×1 IMPLANT
SUT VICRYL 4-0 PS2 18IN ABS (SUTURE) IMPLANT
TOWEL OR 17X24 6PK STRL BLUE (TOWEL DISPOSABLE) ×3 IMPLANT
TOWEL OR 17X26 10 PK STRL BLUE (TOWEL DISPOSABLE) ×3 IMPLANT
UNDERPAD 30X30 INCONTINENT (UNDERPADS AND DIAPERS) ×3 IMPLANT
WATER STERILE IRR 1000ML POUR (IV SOLUTION) ×3 IMPLANT

## 2013-10-18 NOTE — Preoperative (Signed)
Beta Blockers   Reason not to administer Beta Blockers:Not Applicable, took metoprolol today. 

## 2013-10-18 NOTE — Op Note (Signed)
    Patient name: Devin Diaz MRN: [id number] DOB: 10/31/1929 Sex: male  10/18/2013 Pre-operative Diagnosis: End stage renal disease Post-operative diagnosis:  Same Surgeon:  Eldridge Abrahams Assistants:  Lennie Muckle Procedure:   Left radiocephalic fistula Anesthesia:  MAC converted to LMA Blood Loss:  See anesthesia record Specimens:  None  Findings:  Excellent caliber artery and vein  Indications:  The patient has not yet on dialysis.  He comes in today for fistula creation.  Procedure:  The patient was identified in the holding area and taken to [place] 10  The patient was then placed supine on the table. MAC anesthesia was administered.  This was later converted to LMA.  The patient was prepped and draped in the usual sterile fashion.  A time out was called and antibiotics were administered.  Ultrasound was used to map the cephalic vein in the upper arm.  It was of good caliber and easily compressible.  1% lidocaine was used for local anesthesia.  A longitudinal incision was made just proximal to the wrist.  Sharply the cephalic vein was dissected free.  Side branches were ligated with silk ties.  The vein was marked with an ink pen for orientation.  The same incision the radial artery was dissected free.  This was about a 2.5 mm artery with no disease present.  The patient was given 3000 units of heparin.  The vein was occluded distally and ligated.  It distended nicely with heparinized saline.  The artery was occluded with vascular clamps.  A #11 blade was used to make an arteriotomy which was extended longitudinally with Potts scissors.  The vein was cut to the appropriate length and then spatulated to fit the size of the arteriotomy.  A running anastomosis was created with 6-0 Prolene.  Prior to completion, the appropriate flushing maneuvers were performed.  There was an excellent thrill within the fistula which was easily palpable.  The patient also had excellent Doppler signals in  the radial artery distal to the fistula.  25 mg of protamine was then administered.  The incision was then closed in 2 layers of 3-0 Vicryl, followed by Dermabond.   Disposition:  To PACU in stable condition.   Theotis Burrow, M.D. [facility] [place] Office: [phone] Pager:  [phone]

## 2013-10-18 NOTE — Anesthesia Procedure Notes (Signed)
Procedure Name: LMA Insertion Date/Time: 10/18/2013 9:38 AM Performed by: Ned Grace Pre-anesthesia Checklist: Patient identified, Patient being monitored, Emergency Drugs available, Timeout performed and Suction available Patient Re-evaluated:Patient Re-evaluated prior to inductionOxygen Delivery Method: Circle system utilized Preoxygenation: Pre-oxygenation with 100% oxygen Intubation Type: IV induction LMA: LMA inserted LMA Size: 5.0 Tube secured with: Tape Dental Injury: Teeth and Oropharynx as per pre-operative assessment

## 2013-10-18 NOTE — Transfer of Care (Signed)
Immediate Anesthesia Transfer of Care Note  Patient: Devin Diaz  Procedure(s) Performed: Procedure(s): ARTERIOVENOUS (AV) FISTULA CREATION- Radiocephalic vs. brachiocephalic  (Left)  Patient Location: PACU  Anesthesia Type:General  Level of Consciousness: awake, alert  and patient cooperative  Airway & Oxygen Therapy: Patient Spontanous Breathing and Patient connected to nasal cannula oxygen  Post-op Assessment: Report given to PACU RN, Post -op Vital signs reviewed and stable and Patient moving all extremities X 4  Post vital signs: Reviewed and stable  Complications: No apparent anesthesia complications

## 2013-10-18 NOTE — Interval H&P Note (Signed)
History and Physical Interval Note:  10/18/2013 8:56 AM  Devin Diaz  has presented today for surgery, with the diagnosis of ESRD  The various methods of treatment have been discussed with the patient and family. After consideration of risks, benefits and other options for treatment, the patient has consented to  Procedure(s): ARTERIOVENOUS (AV) FISTULA CREATION- Radiocephalic vs. brachiocephalic  (Left) as a surgical intervention .  The patient's history has been reviewed, patient examined, no change in status, stable for surgery.  I have reviewed the patient's chart and labs.  Questions were answered to the patient's satisfaction.     BRABHAM IV, V. WELLS

## 2013-10-18 NOTE — Anesthesia Preprocedure Evaluation (Addendum)
Anesthesia Evaluation  Patient identified by MRN, date of birth, ID band Patient awake    Reviewed: Allergy & Precautions, H&P , NPO status , Patient's Chart, lab work & pertinent test results  Airway Mallampati: I TM Distance: >3 FB Neck ROM: Full    Dental   Pulmonary shortness of breath, COPDformer smoker,  + rhonchi         Cardiovascular hypertension, + Peripheral Vascular Disease + dysrhythmias Atrial Fibrillation Rhythm:Irregular Rate:Normal     Neuro/Psych Seizures -,  TIACVA    GI/Hepatic   Endo/Other    Renal/GU      Musculoskeletal   Abdominal   Peds  Hematology   Anesthesia Other Findings Renal cell ca-s/p nephrectomy  Reproductive/Obstetrics                          Anesthesia Physical Anesthesia Plan  ASA: IV  Anesthesia Plan: MAC   Post-op Pain Management:    Induction: Intravenous  Airway Management Planned: Simple Face Mask  Additional Equipment:   Intra-op Plan:   Post-operative Plan:   Informed Consent: I have reviewed the patients History and Physical, chart, labs and discussed the procedure including the risks, benefits and alternatives for the proposed anesthesia with the patient or authorized representative who has indicated his/her understanding and acceptance.     Plan Discussed with: CRNA and Surgeon  Anesthesia Plan Comments:         Anesthesia Quick Evaluation

## 2013-10-18 NOTE — H&P (View-Only) (Signed)
   Patient name: Devin Diaz MRN: [id number] DOB: 12/14/1929 Sex: male   Referred by: Dr. Befekadu  Reason for referral:  Chief Complaint  Patient presents with  . Follow-up    evaluation for AVF placement   referred by Dr. Befekadu    HISTORY OF PRESENT ILLNESS: This is an 80-year-old gentleman who was referred for evaluation of permanent dialysis access.  The patient is currently on dialysis Tuesday Thursday Saturday via a right-sided catheter.  He is right-handed.  The patient has a history of renal cell carcinoma and is status post nephrectomy.  He is on an ACE inhibitor for his hypertension.  The patient is managed with a statin for hypercholesterolemia.  The patient has a history of atrial fibrillation and is on chronic Coumadin.  He is thought to have had an embolic stroke to the right MCA in 2011.  Past Medical History  Diagnosis Date  . Renal cell carcinoma   . Chronic kidney disease   . Prostate cancer     XRT 2006  . Strabismus   . TIA (transient ischemic attack)   . Carotid artery disease   . Mixed hyperlipidemia   . Essential hypertension, benign   . Atrial fibrillation   . Stroke     Right MCA  . Type 2 diabetes mellitus   . Seizure disorder   . COPD (chronic obstructive pulmonary disease)   . Atrial fibrillation     Past Surgical History  Procedure Laterality Date  . Left nephrectomy    . Tonsillectomy  1958  . Prostate surgery    . Repair of slipped disc  1975  . Hernia repair  2010    History   Social History  . Marital Status: Married    Spouse Name: N/A    Number of Children: N/A  . Years of Education: N/A   Occupational History  . Retired     [employer] work   Social History Main Topics  . Smoking status: Former Smoker -- 2.00 packs/day for 40 years    Types: Cigarettes    Quit date: 08/29/1985  . Smokeless tobacco: Never Used  . Alcohol Use: No  . Drug Use: No  . Sexual Activity: Not on file   Other Topics Concern  . Not on  file   Social History Narrative  . No narrative on file    Family History  Problem Relation Age of Onset  . Heart disease Mother   . Heart disease Father   . Cancer Sister   . Hypertension Sister   . Cancer Brother   . Hypertension Brother     Allergies as of 09/30/2013  . (No Known Allergies)    Current Outpatient Prescriptions on File Prior to Visit  Medication Sig Dispense Refill  . levETIRAcetam (KEPPRA) 500 MG tablet Take 500 mg by mouth every 12 (twelve) hours.        . metoprolol succinate (TOPROL-XL) 25 MG 24 hr tablet Take 0.5 tablets (12.5 mg total) by mouth daily. Start once your current supply of lopressor is finished  15 tablet  3  . simvastatin (ZOCOR) 40 MG tablet Take 20 mg by mouth at bedtime.        . warfarin (COUMADIN) 5 MG tablet Take 5 mg by mouth as directed. Managed by Dr. Vyas      . amLODipine (NORVASC) 5 MG tablet Take 10 mg by mouth daily.       . calcitRIOL (ROCALTROL) 0.25 MCG capsule   Take 0.25 mcg by mouth daily.       . Cholecalciferol (VITAMIN D-3) 1000 UNITS CAPS Take 1,000 Units by mouth daily.      . furosemide (LASIX) 20 MG tablet Take 60 mg by mouth 2 (two) times daily.       Marland Kitchen losartan (COZAAR) 100 MG tablet Take 100 mg by mouth daily.       . sevelamer carbonate (RENVELA) 800 MG tablet Take 800 mg by mouth 3 (three) times daily with meals.       No current facility-administered medications on file prior to visit.     REVIEW OF SYSTEMS: Cardiovascular: No chest pain, chest pressure, palpitations, orthopnea, or dyspnea on exertion. No claudication or rest pain,  No history of DVT or phlebitis. Pulmonary: No productive cough, asthma or wheezing. Neurologic: No weakness, paresthesias, aphasia, or amaurosis. No dizziness. Hematologic: No bleeding problems or clotting disorders. Musculoskeletal: No joint pain or joint swelling. Gastrointestinal: No blood in stool or hematemesis Genitourinary: No dysuria or hematuria. Psychiatric:: No  history of major depression. Integumentary: No rashes or ulcers. Constitutional: No fever or chills.  PHYSICAL EXAMINATION: General: The patient appears their stated age.  Vital signs are BP 151/107  Pulse 59  Resp 18  Ht 6' (1.829 m)  Wt 180 lb 8 oz (81.874 kg)  BMI 24.47 kg/m2 HEENT:  No gross abnormalities Pulmonary: Respirations are non-labored Musculoskeletal: There are no major deformities.   Neurologic: No focal weakness or paresthesias are detected, Skin: There are no ulcer or rashes noted. Psychiatric: The patient has normal affect. Cardiovascular: There is a regular rate and rhythm without significant murmur appreciated.  Palpable left brachial and radial pulse  Diagnostic Studies: Vein mapping was ordered and reviewed.  The patient has an adequate left cephalic vein measuring 0.3 cm throughout the arm.  He did have an abnormal Allen's test.  He has a high takeoff of the radial artery bilaterally    Assessment:  End-stage renal disease Plan: The patient will be scheduled for a left radiocephalic, possible brachiocephalic fistula on Friday, February 20.  He will need to be off of his Coumadin for 5 days prior to his operation.  I discussed the risks and benefits of the operation including the risk of steal syndrome as well as the need for future interventions.  All the patient's questions were answered today.     Eldridge Abrahams, M.D. [facility] Office: [phone] Pager:  [phone]

## 2013-10-18 NOTE — Anesthesia Postprocedure Evaluation (Signed)
  Anesthesia Post-op Note  Patient: Devin Diaz  Procedure(s) Performed: Procedure(s): ARTERIOVENOUS (AV) FISTULA CREATION- Radiocephalic vs. brachiocephalic  (Left)  Patient Location: PACU  Anesthesia Type:General  Level of Consciousness: awake and alert   Airway and Oxygen Therapy: Patient Spontanous Breathing  Post-op Pain: mild  Post-op Assessment: Post-op Vital signs reviewed, Patient's Cardiovascular Status Stable, Respiratory Function Stable, Patent Airway, No signs of Nausea or vomiting and Pain level controlled  Post-op Vital Signs: Reviewed and stable  Complications: No apparent anesthesia complications

## 2013-10-21 ENCOUNTER — Telehealth: Payer: Self-pay | Admitting: Surgery

## 2013-10-21 ENCOUNTER — Other Ambulatory Visit: Payer: Self-pay | Admitting: *Deleted

## 2013-10-21 ENCOUNTER — Encounter (HOSPITAL_COMMUNITY): Payer: Self-pay | Admitting: Surgery

## 2013-10-21 DIAGNOSIS — Z4931 Encounter for adequacy testing for hemodialysis: Secondary | ICD-10-CM

## 2013-10-21 DIAGNOSIS — N186 End stage renal disease: Secondary | ICD-10-CM

## 2013-10-21 NOTE — Telephone Encounter (Addendum)
Message copied by Gena Fray on Mon Oct 21, 2013  2:38 PM ------      Message from: Denman George      Created: Fri Oct 18, 2013 10:26 AM      Regarding: Micheline Rough                   ----- Message -----         From: [name], MD         Sent: 10/18/2013  10:17 AM           To: [facility] Charge Pool            10/18/2013:            Surgeon:  Eldridge Abrahams      Assistants:  Lennie Muckle      Procedure:   Left radiocephalic fistula                  Schedule the patient to followup with me in 6 weeks with a duplex of his fistula. ------  10/21/13: lm for pt re appt, dpm

## 2013-10-26 ENCOUNTER — Other Ambulatory Visit: Payer: Self-pay | Admitting: Cardiology

## 2013-11-08 ENCOUNTER — Encounter: Payer: Self-pay | Admitting: Cardiology

## 2013-11-28 ENCOUNTER — Encounter: Payer: Self-pay | Admitting: Surgery

## 2013-12-02 ENCOUNTER — Ambulatory Visit (HOSPITAL_COMMUNITY)
Admission: RE | Admit: 2013-12-02 | Discharge: 2013-12-02 | Disposition: A | Discharge status: Home / Self Care | Payer: Medicare Other | Source: Ambulatory Visit | Attending: Surgery | Admitting: Surgery

## 2013-12-02 ENCOUNTER — Ambulatory Visit (INDEPENDENT_AMBULATORY_CARE_PROVIDER_SITE_OTHER): Payer: Self-pay | Admitting: Surgery

## 2013-12-02 ENCOUNTER — Encounter: Payer: Self-pay | Admitting: *Deleted

## 2013-12-02 ENCOUNTER — Other Ambulatory Visit: Payer: Self-pay | Admitting: *Deleted

## 2013-12-02 ENCOUNTER — Encounter: Payer: Self-pay | Admitting: Surgery

## 2013-12-02 VITALS — BP 141/96 | HR 68 | Ht 72.0 in | Wt 178.0 lb

## 2013-12-02 DIAGNOSIS — N186 End stage renal disease: Secondary | ICD-10-CM | POA: Insufficient documentation

## 2013-12-02 DIAGNOSIS — Z4931 Encounter for adequacy testing for hemodialysis: Secondary | ICD-10-CM | POA: Insufficient documentation

## 2013-12-02 NOTE — Progress Notes (Signed)
Patient name: Devin Diaz MRN: [id number] DOB: [birth year] Sex: male     Chief Complaint  Patient presents with  . Re-evaluation    6 wk f/u s/p L RC fistula    HISTORY OF PRESENT ILLNESS: The patient is status post left radiocephalic fistula on 29/92/4268.  He is back today for his first postoperative followup.  He is already on dialysis.  Past Medical History  Diagnosis Date  . Prostate cancer     XRT 2006  . Strabismus   . TIA (transient ischemic attack)   . Carotid artery disease   . Mixed hyperlipidemia   . Essential hypertension, benign   . Atrial fibrillation   . Seizure disorder   . COPD (chronic obstructive pulmonary disease)   . Atrial fibrillation   . Shortness of breath   . Thyroid nodule     [facility]  . Renal cell carcinoma   . Chronic kidney disease     Hemodialysis T, Th . S in [place]  . Constipation   . Seizures     "long time ago"  . Dysrhythmia     Atrial Fifrillation  . History of blood transfusion 2008    nephrectomy  . Stroke     Right MCA.  Left hand cold, weaker than Right    Past Surgical History  Procedure Laterality Date  . Left nephrectomy  2008  . Tonsillectomy  1958  . Prostate surgery    . Repair of slipped disc  1975  . Hernia repair  2010  . Insertion of dialysis catheter Left   . Av fistula placement Left 10/18/2013    Procedure: ARTERIOVENOUS (AV) FISTULA CREATION- Radiocephalic vs. brachiocephalic ;  Surgeon: [name], MD;  Location: [facility];  Service: Vascular;  Laterality: Left;    History   Social History  . Marital Status: Married    Spouse Name: N/A    Number of Children: N/A  . Years of Education: N/A   Occupational History  . Retired     [employer] work   Social History Main Topics  . Smoking status: Former Smoker -- 2.00 packs/day for 40 years    Types: Cigarettes    Quit date: 08/29/1985  . Smokeless tobacco: Never Used  . Alcohol Use: No  . Drug Use: No  . Sexual Activity: Not on file    Other Topics Concern  . Not on file   Social History Narrative  . No narrative on file    Family History  Problem Relation Age of Onset  . Heart disease Mother   . Heart disease Father   . Cancer Sister   . Hypertension Sister   . Cancer Brother   . Hypertension Brother     Allergies as of 12/02/2013  . (No Known Allergies)    Current Outpatient Prescriptions on File Prior to Visit  Medication Sig Dispense Refill  . B Complex-C-Folic Acid (NEPHRO-VITE PO) Take 1 tablet by mouth daily with supper.       . isosorbide mononitrate (IMDUR) 30 MG 24 hr tablet Take 30 mg by mouth daily.      Marland Kitchen levETIRAcetam (KEPPRA) 500 MG tablet Take 500 mg by mouth every 12 (twelve) hours.       . metoprolol succinate (TOPROL-XL) 25 MG 24 hr tablet TAKE 1/2 TABLET BY MOUTH DAILY. EMERGENCY REFILL FAXED DR  30 tablet  9  . oxyCODONE-acetaminophen (PERCOCET/ROXICET) 5-325 MG per tablet Take 1 tablet by mouth every  6 (six) hours as needed.  30 tablet  0  . simvastatin (ZOCOR) 20 MG tablet Take 20 mg by mouth at bedtime.      Marland Kitchen warfarin (COUMADIN) 5 MG tablet Take 2.5-5 mg by mouth daily. Take 2.5mg  (half a tablet ) daily except 5mg  (one whole tablet) on mondays, Wednesdays, and Fridays       No current facility-administered medications on file prior to visit.       PHYSICAL EXAMINATION:   Vital signs are BP 141/96  Pulse 68  Ht 6' (1.829 m)  Wt 178 lb (80.74 kg)  BMI 24.14 kg/m2  SpO2 97% General: The patient appears their stated age. HEENT:  No gross abnormalities Pulmonary:  Non labored breathing Musculoskeletal: There are no major deformities. Neurologic: No focal weakness or paresthesias are detected, Skin: There are no ulcer or rashes noted. Psychiatric: The patient has normal affect. Cardiovascular: Palpable thrill within left radiocephalic fistula   Diagnostic Studies I have reviewed the patient's ultrasound.  The vein diameters range from 0.37 year the anastomosis, however  in the more proximal arm the vein diameters are all greater than 0.6 cm.  There is a large branch with a diameter of 0.69 cm just proximal to the anastomosis.  Assessment: End-stage renal disease Plan: I discussed proceeding with branch ligation.  This is been scheduled for Friday, April 17.  Following this procedure, however anticipate that this fistula would be ready for use in approximately one month  V. Leia Alf, M.D. [facility] Office: [phone] Pager:  [phone]

## 2013-12-04 ENCOUNTER — Encounter (HOSPITAL_COMMUNITY): Payer: Self-pay | Admitting: Pharmacy Technician

## 2013-12-06 ENCOUNTER — Ambulatory Visit (INDEPENDENT_AMBULATORY_CARE_PROVIDER_SITE_OTHER): Payer: Medicare Other | Admitting: Cardiology

## 2013-12-06 ENCOUNTER — Encounter: Payer: Self-pay | Admitting: Cardiology

## 2013-12-06 VITALS — BP 137/84 | HR 82 | Ht 72.0 in | Wt 174.1 lb

## 2013-12-06 DIAGNOSIS — E782 Mixed hyperlipidemia: Secondary | ICD-10-CM

## 2013-12-06 DIAGNOSIS — I10 Essential (primary) hypertension: Secondary | ICD-10-CM

## 2013-12-06 DIAGNOSIS — I4891 Unspecified atrial fibrillation: Secondary | ICD-10-CM

## 2013-12-06 NOTE — Assessment & Plan Note (Signed)
Blood pressure reasonable today. He is off additional antihypertensives. Awaiting to see what his blood pressure will do with further hemodialysis sessions.

## 2013-12-06 NOTE — Patient Instructions (Signed)
Your physician recommends that you schedule a follow-up appointment in: 6 months. You will receive a reminder letter in the mail in about months reminding you to call and schedule your appointment. If you don't receive this letter, please contact our office. Your physician recommends that you continue on your current medications as directed. Please refer to the Current Medication list given to you today. 

## 2013-12-06 NOTE — Progress Notes (Signed)
    Clinical Summary Mr. Tuch is an 80 y.o.male last seen in October 2014. He has had significant health changes since I last saw him, now with ESRD on hemodialysis. Still has a temporary dialysis catheter awaiting maturing and revision of a fistula on the left.  He is here with his wife today, seems to be in reasonably good spirits. He does state that he feels better, breathing is more easy, and his appetite is improving. He did lose a lot of weight with illness back in December.  Lab work from March 2015 showed BUN 35, creatinine 3.3, potassium 4.3, normal LFTs, hemoglobin 11.6, pulse 131.  Reports a sense of palpitations, chest pain, no bleeding problems on Coumadin.  No Known Allergies  Current Outpatient Prescriptions  Medication Sig Dispense Refill  . B Complex-C-Folic Acid (NEPHRO-VITE PO) Take 1 tablet by mouth daily with supper.       . isosorbide mononitrate (IMDUR) 30 MG 24 hr tablet Take 30 mg by mouth daily.      Marland Kitchen levETIRAcetam (KEPPRA) 500 MG tablet Take 500 mg by mouth 2 (two) times daily.       . metoprolol succinate (TOPROL-XL) 25 MG 24 hr tablet Take 12.5 mg by mouth daily.      . simvastatin (ZOCOR) 20 MG tablet Take 20 mg by mouth at bedtime.      Marland Kitchen warfarin (COUMADIN) 5 MG tablet Take 5 mg by mouth daily.        No current facility-administered medications for this visit.    Past Medical History  Diagnosis Date  . Prostate cancer     XRT 2006  . Strabismus   . TIA (transient ischemic attack)   . Carotid artery disease   . Mixed hyperlipidemia   . Essential hypertension, benign   . Atrial fibrillation   . Seizure disorder   . COPD (chronic obstructive pulmonary disease)   . Atrial fibrillation   . Thyroid nodule     [facility]  . Renal cell carcinoma   . ESRD on hemodialysis     Hemodialysis T/Th/S in [place]  . History of blood transfusion 2008    Nephrectomy  . Stroke     Right MCA    Social History Mr. Markoff reports that he quit  smoking about 28 years ago. His smoking use included Cigarettes. He has a 80 pack-year smoking history. He has never used smokeless tobacco. Mr. Ksiazek reports that he does not drink alcohol.  Review of Systems Walking with a cane. Otherwise negative except as outlined.  Physical Examination Filed Vitals:   12/06/13 1007  BP: 137/84  Pulse: 82   Filed Weights   12/06/13 1007  Weight: 174 lb 1.9 oz (78.98 kg)    Chronically ill-appearing, but no distress.  HEENT: Conjunctiva and lids normal, oropharynx clear.  Neck: Supple, no elevated JVP or bruits.  Lungs: Clear auscultation, nonlabored.  Cardiac: Irregularly irregular, no S3.  Skin: Warm and dry.  Musculoskeletal: No kyphosis.  Extremities: 1+ edema below the knees with some stasis, distal pulses one plus.    Problem List and Plan   Atrial fibrillation Continue current medications including Toprol-XL and Coumadin.  Essential hypertension, benign Blood pressure reasonable today. He is off additional antihypertensives. Awaiting to see what his blood pressure will do with further hemodialysis sessions.  Mixed hyperlipidemia Continues on Zocor, followed by Dr. Woody Seller.    Satira Sark, M.D., F.A.C.C.

## 2013-12-06 NOTE — Assessment & Plan Note (Signed)
Continue current medications including Toprol-XL and Coumadin.

## 2013-12-06 NOTE — Assessment & Plan Note (Signed)
Continues on Zocor, followed by Dr. Vyas. 

## 2013-12-12 ENCOUNTER — Encounter (HOSPITAL_COMMUNITY): Payer: Self-pay | Admitting: *Deleted

## 2013-12-12 MED ORDER — DEXTROSE 5 % IV SOLN
1.5000 g | INTRAVENOUS | Status: AC
  Administered 2013-12-13: 1.5 g via INTRAVENOUS
  Filled 2013-12-12: qty 1.5

## 2013-12-12 NOTE — Progress Notes (Signed)
Pt's wife, Devin Diaz verified medical/surgical hx, allergies and meds. Given pre-op instructions.

## 2013-12-13 ENCOUNTER — Ambulatory Visit (HOSPITAL_COMMUNITY)
Admission: RE | Admit: 2013-12-13 | Discharge: 2013-12-13 | Disposition: A | Discharge status: Home / Self Care | Payer: Medicare Other | Source: Ambulatory Visit | Attending: Surgery | Admitting: Surgery

## 2013-12-13 ENCOUNTER — Encounter (HOSPITAL_COMMUNITY): Payer: Self-pay | Admitting: Anesthesiology

## 2013-12-13 ENCOUNTER — Encounter (HOSPITAL_COMMUNITY)
Admission: RE | Disposition: A | Discharge status: Home / Self Care | Payer: Self-pay | Source: Ambulatory Visit | Attending: Surgery

## 2013-12-13 ENCOUNTER — Ambulatory Visit (HOSPITAL_COMMUNITY): Payer: Medicare Other | Admitting: Anesthesiology

## 2013-12-13 ENCOUNTER — Encounter (HOSPITAL_COMMUNITY): Payer: Medicare Other | Admitting: Anesthesiology

## 2013-12-13 DIAGNOSIS — Z8673 Personal history of transient ischemic attack (TIA), and cerebral infarction without residual deficits: Secondary | ICD-10-CM | POA: Insufficient documentation

## 2013-12-13 DIAGNOSIS — I739 Peripheral vascular disease, unspecified: Secondary | ICD-10-CM | POA: Insufficient documentation

## 2013-12-13 DIAGNOSIS — Z87891 Personal history of nicotine dependence: Secondary | ICD-10-CM | POA: Insufficient documentation

## 2013-12-13 DIAGNOSIS — Z8546 Personal history of malignant neoplasm of prostate: Secondary | ICD-10-CM | POA: Insufficient documentation

## 2013-12-13 DIAGNOSIS — Z923 Personal history of irradiation: Secondary | ICD-10-CM | POA: Insufficient documentation

## 2013-12-13 DIAGNOSIS — G40909 Epilepsy, unspecified, not intractable, without status epilepticus: Secondary | ICD-10-CM | POA: Insufficient documentation

## 2013-12-13 DIAGNOSIS — N186 End stage renal disease: Secondary | ICD-10-CM | POA: Insufficient documentation

## 2013-12-13 DIAGNOSIS — J4489 Other specified chronic obstructive pulmonary disease: Secondary | ICD-10-CM | POA: Insufficient documentation

## 2013-12-13 DIAGNOSIS — J449 Chronic obstructive pulmonary disease, unspecified: Secondary | ICD-10-CM | POA: Insufficient documentation

## 2013-12-13 DIAGNOSIS — Z905 Acquired absence of kidney: Secondary | ICD-10-CM | POA: Insufficient documentation

## 2013-12-13 DIAGNOSIS — I4891 Unspecified atrial fibrillation: Secondary | ICD-10-CM | POA: Insufficient documentation

## 2013-12-13 DIAGNOSIS — T82898A Other specified complication of vascular prosthetic devices, implants and grafts, initial encounter: Secondary | ICD-10-CM

## 2013-12-13 DIAGNOSIS — I120 Hypertensive chronic kidney disease with stage 5 chronic kidney disease or end stage renal disease: Secondary | ICD-10-CM | POA: Insufficient documentation

## 2013-12-13 DIAGNOSIS — E782 Mixed hyperlipidemia: Secondary | ICD-10-CM | POA: Insufficient documentation

## 2013-12-13 DIAGNOSIS — Z992 Dependence on renal dialysis: Secondary | ICD-10-CM | POA: Insufficient documentation

## 2013-12-13 DIAGNOSIS — Z8553 Personal history of malignant neoplasm of renal pelvis: Secondary | ICD-10-CM | POA: Insufficient documentation

## 2013-12-13 HISTORY — PX: LIGATION OF COMPETING BRANCHES OF ARTERIOVENOUS FISTULA: SHX5949

## 2013-12-13 LAB — POCT I-STAT 4, (NA,K, GLUC, HGB,HCT)
GLUCOSE: 90 mg/dL (ref 70–99)
HEMATOCRIT: 36 % — AB (ref 39.0–52.0)
Hemoglobin: 12.2 g/dL — ABNORMAL LOW (ref 13.0–17.0)
Potassium: 4.2 mEq/L (ref 3.7–5.3)
Sodium: 136 mEq/L — ABNORMAL LOW (ref 137–147)

## 2013-12-13 LAB — APTT: aPTT: 28 seconds (ref 24–37)

## 2013-12-13 LAB — PROTIME-INR
INR: 1.25 (ref 0.00–1.49)
Prothrombin Time: 15.4 seconds — ABNORMAL HIGH (ref 11.6–15.2)

## 2013-12-13 SURGERY — LIGATION OF COMPETING BRANCHES OF ARTERIOVENOUS FISTULA
Anesthesia: Monitor Anesthesia Care | Site: Arm Lower | Laterality: Left

## 2013-12-13 MED ORDER — 0.9 % SODIUM CHLORIDE (POUR BTL) OPTIME
TOPICAL | Status: DC | PRN
  Administered 2013-12-13: 1000 mL

## 2013-12-13 MED ORDER — HYDROMORPHONE HCL PF 1 MG/ML IJ SOLN: 0.2500 mg | INTRAMUSCULAR | Status: DC | PRN

## 2013-12-13 MED ORDER — PROPOFOL 10 MG/ML IV BOLUS
INTRAVENOUS | Status: AC
  Filled 2013-12-13: qty 20

## 2013-12-13 MED ORDER — SODIUM CHLORIDE 0.9 % IV SOLN
INTRAVENOUS | Status: DC | PRN
  Administered 2013-12-13: 07:00:00 via INTRAVENOUS

## 2013-12-13 MED ORDER — FENTANYL CITRATE 0.05 MG/ML IJ SOLN
INTRAMUSCULAR | Status: AC
  Filled 2013-12-13: qty 5

## 2013-12-13 MED ORDER — OXYCODONE-ACETAMINOPHEN 5-325 MG PO TABS
1.0000 | ORAL_TABLET | Freq: Four times a day (QID) | ORAL | Status: DC | PRN
Start: 2013-12-13 — End: 2014-06-09

## 2013-12-13 MED ORDER — FENTANYL CITRATE 0.05 MG/ML IJ SOLN
INTRAMUSCULAR | Status: DC | PRN
  Administered 2013-12-13: 25 ug via INTRAVENOUS

## 2013-12-13 MED ORDER — CHLORHEXIDINE GLUCONATE CLOTH 2 % EX PADS: 6.0000 | MEDICATED_PAD | Freq: Once | CUTANEOUS | Status: DC

## 2013-12-13 MED ORDER — THROMBIN 20000 UNITS EX SOLR
CUTANEOUS | Status: AC
  Filled 2013-12-13: qty 20000

## 2013-12-13 MED ORDER — LIDOCAINE-EPINEPHRINE (PF) 1 %-1:200000 IJ SOLN
INTRAMUSCULAR | Status: DC | PRN
  Administered 2013-12-13: 5 mL

## 2013-12-13 MED ORDER — ONDANSETRON HCL 4 MG/2ML IJ SOLN: 4.0000 mg | Freq: Once | INTRAMUSCULAR | Status: DC | PRN

## 2013-12-13 MED ORDER — PROPOFOL INFUSION 10 MG/ML OPTIME
INTRAVENOUS | Status: DC | PRN
  Administered 2013-12-13: 50 ug/kg/min via INTRAVENOUS

## 2013-12-13 MED ORDER — SODIUM CHLORIDE 0.9 % IV SOLN: INTRAVENOUS | Status: DC

## 2013-12-13 MED ORDER — LIDOCAINE HCL (CARDIAC) 20 MG/ML IV SOLN
INTRAVENOUS | Status: DC | PRN
  Administered 2013-12-13: 60 mg via INTRAVENOUS

## 2013-12-13 SURGICAL SUPPLY — 44 items
CANISTER SUCTION 2500CC (MISCELLANEOUS) ×3 IMPLANT
CLIP TI MEDIUM 6 (CLIP) ×3 IMPLANT
CLIP TI WIDE RED SMALL 6 (CLIP) ×3 IMPLANT
COVER SURGICAL LIGHT HANDLE (MISCELLANEOUS) ×3 IMPLANT
COVER TRANSDUCER ULTRASND GEL (DRAPE) ×3 IMPLANT
DERMABOND ADVANCED (GAUZE/BANDAGES/DRESSINGS) ×2
DERMABOND ADVANCED .7 DNX12 (GAUZE/BANDAGES/DRESSINGS) ×1 IMPLANT
ELECT REM PT RETURN 9FT ADLT (ELECTROSURGICAL) ×3
ELECTRODE REM PT RTRN 9FT ADLT (ELECTROSURGICAL) ×1 IMPLANT
GEL ULTRASOUND 20GR AQUASONIC (MISCELLANEOUS) IMPLANT
GLOVE BIOGEL PI IND STRL 6.5 (GLOVE) ×1 IMPLANT
GLOVE BIOGEL PI IND STRL 7.0 (GLOVE) ×1 IMPLANT
GLOVE BIOGEL PI IND STRL 7.5 (GLOVE) ×2 IMPLANT
GLOVE BIOGEL PI IND STRL 8 (GLOVE) ×1 IMPLANT
GLOVE BIOGEL PI INDICATOR 6.5 (GLOVE) ×2
GLOVE BIOGEL PI INDICATOR 7.0 (GLOVE) ×2
GLOVE BIOGEL PI INDICATOR 7.5 (GLOVE) ×4
GLOVE BIOGEL PI INDICATOR 8 (GLOVE) ×2
GLOVE ECLIPSE 6.5 STRL STRAW (GLOVE) ×3 IMPLANT
GLOVE SS BIOGEL STRL SZ 7 (GLOVE) ×1 IMPLANT
GLOVE SUPERSENSE BIOGEL SZ 7 (GLOVE) ×2
GLOVE SURG SS PI 7.5 STRL IVOR (GLOVE) ×3 IMPLANT
GOWN STRL REUS W/ TWL LRG LVL3 (GOWN DISPOSABLE) ×2 IMPLANT
GOWN STRL REUS W/ TWL XL LVL3 (GOWN DISPOSABLE) ×1 IMPLANT
GOWN STRL REUS W/TWL LRG LVL3 (GOWN DISPOSABLE) ×4
GOWN STRL REUS W/TWL XL LVL3 (GOWN DISPOSABLE) ×2
HEMOSTAT SNOW SURGICEL 2X4 (HEMOSTASIS) IMPLANT
KIT BASIN OR (CUSTOM PROCEDURE TRAY) ×3 IMPLANT
KIT ROOM TURNOVER OR (KITS) ×3 IMPLANT
NS IRRIG 1000ML POUR BTL (IV SOLUTION) ×3 IMPLANT
PACK CV ACCESS (CUSTOM PROCEDURE TRAY) ×3 IMPLANT
PAD ARMBOARD 7.5X6 YLW CONV (MISCELLANEOUS) ×6 IMPLANT
SUT ETHILON 3 0 PS 1 (SUTURE) IMPLANT
SUT PROLENE 6 0 BV (SUTURE) IMPLANT
SUT SILK 0 TIES 10X30 (SUTURE) IMPLANT
SUT VIC AB 3-0 SH 27 (SUTURE) ×2
SUT VIC AB 3-0 SH 27X BRD (SUTURE) ×1 IMPLANT
SUT VICRYL 4-0 PS2 18IN ABS (SUTURE) ×3 IMPLANT
SWAB COLLECTION DEVICE MRSA (MISCELLANEOUS) IMPLANT
TOWEL OR 17X24 6PK STRL BLUE (TOWEL DISPOSABLE) ×3 IMPLANT
TOWEL OR 17X26 10 PK STRL BLUE (TOWEL DISPOSABLE) ×3 IMPLANT
TUBE ANAEROBIC SPECIMEN COL (MISCELLANEOUS) IMPLANT
UNDERPAD 30X30 INCONTINENT (UNDERPADS AND DIAPERS) ×3 IMPLANT
WATER STERILE IRR 1000ML POUR (IV SOLUTION) ×3 IMPLANT

## 2013-12-13 NOTE — Anesthesia Preprocedure Evaluation (Addendum)
Anesthesia Evaluation  Patient identified by MRN, date of birth, ID band Patient awake    Reviewed: Allergy & Precautions, H&P , NPO status , Patient's Chart, lab work & pertinent test results  Airway Mallampati: I TM Distance: >3 FB Neck ROM: full    Dental  (+) Teeth Intact, Dental Advidsory Given   Pulmonary COPDformer smoker,          Cardiovascular hypertension, + Peripheral Vascular Disease + dysrhythmias Atrial Fibrillation     Neuro/Psych TIACVA    GI/Hepatic   Endo/Other    Renal/GU ESRF and DialysisRenal disease     Musculoskeletal   Abdominal   Peds  Hematology   Anesthesia Other Findings   Reproductive/Obstetrics                          Anesthesia Physical Anesthesia Plan  ASA: III  Anesthesia Plan: MAC   Post-op Pain Management:    Induction: Intravenous  Airway Management Planned:   Additional Equipment:   Intra-op Plan:   Post-operative Plan:   Informed Consent: I have reviewed the patients History and Physical, chart, labs and discussed the procedure including the risks, benefits and alternatives for the proposed anesthesia with the patient or authorized representative who has indicated his/her understanding and acceptance.   Dental Advisory Given  Plan Discussed with: Anesthesiologist, CRNA and Surgeon  Anesthesia Plan Comments:        Anesthesia Quick Evaluation

## 2013-12-13 NOTE — Interval H&P Note (Signed)
History and Physical Interval Note:  12/13/2013 7:39 AM  Devin Diaz  has presented today for surgery, with the diagnosis of End Stage Renal Disease  The various methods of treatment have been discussed with the patient and family. After consideration of risks, benefits and other options for treatment, the patient has consented to  Procedure(s): LIGATION OF COMPETING BRANCHES OF ARTERIOVENOUS FISTULA- LEFT ARM (Left) as a surgical intervention .  The patient's history has been reviewed, patient examined, no change in status, stable for surgery.  I have reviewed the patient's chart and labs.  Questions were answered to the patient's satisfaction.     [name]

## 2013-12-13 NOTE — H&P (View-Only) (Signed)
Patient name: Devin Diaz MRN: [id number] DOB: [birth year] Sex: male     Chief Complaint  Patient presents with  . Re-evaluation    6 wk f/u s/p L RC fistula    HISTORY OF PRESENT ILLNESS: The patient is status post left radiocephalic fistula on 29/92/4268.  He is back today for his first postoperative followup.  He is already on dialysis.  Past Medical History  Diagnosis Date  . Prostate cancer     XRT 2006  . Strabismus   . TIA (transient ischemic attack)   . Carotid artery disease   . Mixed hyperlipidemia   . Essential hypertension, benign   . Atrial fibrillation   . Seizure disorder   . COPD (chronic obstructive pulmonary disease)   . Atrial fibrillation   . Shortness of breath   . Thyroid nodule     [facility]  . Renal cell carcinoma   . Chronic kidney disease     Hemodialysis T, Th . S in [place]  . Constipation   . Seizures     "long time ago"  . Dysrhythmia     Atrial Fifrillation  . History of blood transfusion 2008    nephrectomy  . Stroke     Right MCA.  Left hand cold, weaker than Right    Past Surgical History  Procedure Laterality Date  . Left nephrectomy  2008  . Tonsillectomy  1958  . Prostate surgery    . Repair of slipped disc  1975  . Hernia repair  2010  . Insertion of dialysis catheter Left   . Av fistula placement Left 10/18/2013    Procedure: ARTERIOVENOUS (AV) FISTULA CREATION- Radiocephalic vs. brachiocephalic ;  Surgeon: [name], MD;  Location: [facility];  Service: Vascular;  Laterality: Left;    History   Social History  . Marital Status: Married    Spouse Name: N/A    Number of Children: N/A  . Years of Education: N/A   Occupational History  . Retired     [employer] work   Social History Main Topics  . Smoking status: Former Smoker -- 2.00 packs/day for 40 years    Types: Cigarettes    Quit date: 08/29/1985  . Smokeless tobacco: Never Used  . Alcohol Use: No  . Drug Use: No  . Sexual Activity: Not on file    Other Topics Concern  . Not on file   Social History Narrative  . No narrative on file    Family History  Problem Relation Age of Onset  . Heart disease Mother   . Heart disease Father   . Cancer Sister   . Hypertension Sister   . Cancer Brother   . Hypertension Brother     Allergies as of 12/02/2013  . (No Known Allergies)    Current Outpatient Prescriptions on File Prior to Visit  Medication Sig Dispense Refill  . B Complex-C-Folic Acid (NEPHRO-VITE PO) Take 1 tablet by mouth daily with supper.       . isosorbide mononitrate (IMDUR) 30 MG 24 hr tablet Take 30 mg by mouth daily.      Marland Kitchen levETIRAcetam (KEPPRA) 500 MG tablet Take 500 mg by mouth every 12 (twelve) hours.       . metoprolol succinate (TOPROL-XL) 25 MG 24 hr tablet TAKE 1/2 TABLET BY MOUTH DAILY. EMERGENCY REFILL FAXED DR  30 tablet  9  . oxyCODONE-acetaminophen (PERCOCET/ROXICET) 5-325 MG per tablet Take 1 tablet by mouth every  6 (six) hours as needed.  30 tablet  0  . simvastatin (ZOCOR) 20 MG tablet Take 20 mg by mouth at bedtime.      Marland Kitchen warfarin (COUMADIN) 5 MG tablet Take 2.5-5 mg by mouth daily. Take 2.5mg  (half a tablet ) daily except 5mg  (one whole tablet) on mondays, Wednesdays, and Fridays       No current facility-administered medications on file prior to visit.       PHYSICAL EXAMINATION:   Vital signs are BP 141/96  Pulse 68  Ht 6' (1.829 m)  Wt 178 lb (80.74 kg)  BMI 24.14 kg/m2  SpO2 97% General: The patient appears their stated age. HEENT:  No gross abnormalities Pulmonary:  Non labored breathing Musculoskeletal: There are no major deformities. Neurologic: No focal weakness or paresthesias are detected, Skin: There are no ulcer or rashes noted. Psychiatric: The patient has normal affect. Cardiovascular: Palpable thrill within left radiocephalic fistula   Diagnostic Studies I have reviewed the patient's ultrasound.  The vein diameters range from 0.37 year the anastomosis, however  in the more proximal arm the vein diameters are all greater than 0.6 cm.  There is a large branch with a diameter of 0.69 cm just proximal to the anastomosis.  Assessment: End-stage renal disease Plan: I discussed proceeding with branch ligation.  This is been scheduled for Friday, April 17.  Following this procedure, however anticipate that this fistula would be ready for use in approximately one month  V. Leia Alf, M.D. [facility] Office: [phone] Pager:  [phone]

## 2013-12-13 NOTE — Anesthesia Postprocedure Evaluation (Signed)
  Anesthesia Post-op Note  Patient: Devin Diaz  Procedure(s) Performed: Procedure(s): LIGATION OF COMPETING BRANCHES OF ARTERIOVENOUS FISTULA- LEFT ARM (Left)  Patient Location: PACU  Anesthesia Type:MAC  Level of Consciousness: awake, alert , oriented and patient cooperative  Airway and Oxygen Therapy: Patient Spontanous Breathing  Post-op Pain: mild  Post-op Assessment: Post-op Vital signs reviewed, Patient's Cardiovascular Status Stable, Respiratory Function Stable, Patent Airway, No signs of Nausea or vomiting and Pain level controlled  Post-op Vital Signs: stable  Last Vitals:  Filed Vitals:   12/13/13 0633  BP: 134/81  Pulse: 73  Temp: 36.6 C  Resp: 17    Complications: No apparent anesthesia complications

## 2013-12-13 NOTE — Addendum Note (Signed)
Addendum created 12/13/13 7673 by Neldon Newport, CRNA   Modules edited: Anesthesia Flowsheet

## 2013-12-13 NOTE — Transfer of Care (Signed)
Immediate Anesthesia Transfer of Care Note  Patient: Devin Diaz  Procedure(s) Performed: Procedure(s): LIGATION OF COMPETING BRANCHES OF ARTERIOVENOUS FISTULA- LEFT ARM (Left)  Patient Location: PACU  Anesthesia Type:MAC  Level of Consciousness: awake, alert  and oriented  Airway & Oxygen Therapy: Patient Spontanous Breathing  Post-op Assessment: Report given to PACU RN, Post -op Vital signs reviewed and stable and Patient moving all extremities X 4  Post vital signs: Reviewed and stable  Complications: No apparent anesthesia complications

## 2013-12-13 NOTE — Op Note (Signed)
    Patient name: TANISH PRIEN MRN: [id number] DOB: Mar 03, 1930 Sex: male  12/13/2013 Pre-operative Diagnosis: ESRD Post-operative diagnosis:  Same Surgeon:  [name] Assistants:  Lennie Muckle  Procedure:   Branch ligation of L wrist AVF Anesthesia:  MAC Blood Loss:  See anesthesia record Specimens:  none  Indications:  S/p Left wrist AVF.  U/S shows large competing branch  Procedure:  The patient was identified in the holding area and taken to [place] [facility]  The patient was then placed supine on the table. IV sedation anesthesia was administered.  The patient was prepped and draped in the usual sterile fashion.  A time out was called and antibiotics were administered.  1% lidocaine was used for local.  U/S identified the large branch.  A #10 blade was used to make an incision.  The cephalic vein and large branch were dissected free.  The branch was 6-23mm.  It was ligated with 2 2-0 silk sutures.  There was an excellent thrill in the AVF after branch ligation.  The incision was closed with 2 layers of 3-0 vicryl and dermabond.   Disposition:  To PACU in stable condition.   Theotis Burrow, M.D. [facility] Office: [phone] Pager:  [phone]

## 2013-12-13 NOTE — Discharge Instructions (Signed)

## 2013-12-16 ENCOUNTER — Telehealth: Payer: Self-pay | Admitting: Surgery

## 2013-12-16 ENCOUNTER — Encounter (HOSPITAL_COMMUNITY): Payer: Self-pay | Admitting: Surgery

## 2013-12-16 NOTE — Telephone Encounter (Addendum)
Message copied by Gena Fray on Mon Dec 16, 2013 10:19 AM ------      Message from: Mena Goes      Created: Fri Dec 13, 2013 10:43 AM      Regarding: schedule                   ----- Message -----         From: Ulyses Amor, PA-C         Sent: 12/13/2013   9:00 AM           To: [facility] Charge Pool            F/U in office in 4 weeks with Dr. Trula Slade s/p ligation of branch av fistula ------  12/16/13: lm for pt re appt, dpm

## 2014-01-10 ENCOUNTER — Encounter: Payer: Self-pay | Admitting: Surgery

## 2014-01-13 ENCOUNTER — Ambulatory Visit (INDEPENDENT_AMBULATORY_CARE_PROVIDER_SITE_OTHER): Payer: Self-pay | Admitting: Surgery

## 2014-01-13 ENCOUNTER — Encounter: Payer: Self-pay | Admitting: Surgery

## 2014-01-13 VITALS — BP 131/82 | HR 57 | Ht 72.0 in | Wt 181.9 lb

## 2014-01-13 DIAGNOSIS — N186 End stage renal disease: Secondary | ICD-10-CM

## 2014-01-13 NOTE — Progress Notes (Signed)
The patient is back today for followup.  On 10/18/2013, he underwent left radiocephalic fistula.  On 12/13/2013, he underwent ligation of a competing branches.  He currently dialyzes through a catheter.  On examination.  All his incisions are healed.  There is an excellent thrill within the fistula.  I told the patient I recommend utilizing this fistula in 2 weeks.  He will contact me on an as-needed basis.  I told him that he would be cleared to start taking chemotherapy oral agents as of today.  Annamarie Major

## 2014-02-14 ENCOUNTER — Other Ambulatory Visit (HOSPITAL_COMMUNITY): Payer: Self-pay | Admitting: Internal Medicine

## 2014-02-14 DIAGNOSIS — N2889 Other specified disorders of kidney and ureter: Secondary | ICD-10-CM

## 2014-02-24 ENCOUNTER — Encounter: Payer: Self-pay | Admitting: Cardiology

## 2014-03-07 ENCOUNTER — Encounter (HOSPITAL_COMMUNITY): Payer: Medicare Other

## 2014-03-10 ENCOUNTER — Other Ambulatory Visit (HOSPITAL_COMMUNITY): Payer: Self-pay | Admitting: Nephrology

## 2014-03-10 DIAGNOSIS — N186 End stage renal disease: Secondary | ICD-10-CM

## 2014-03-10 DIAGNOSIS — Z992 Dependence on renal dialysis: Principal | ICD-10-CM

## 2014-03-28 ENCOUNTER — Ambulatory Visit (HOSPITAL_COMMUNITY)
Admission: RE | Admit: 2014-03-28 | Discharge: 2014-03-28 | Disposition: A | Discharge status: Home / Self Care | Payer: Medicare Other | Source: Ambulatory Visit | Attending: Nephrology | Admitting: Nephrology

## 2014-03-28 DIAGNOSIS — N186 End stage renal disease: Secondary | ICD-10-CM

## 2014-03-28 DIAGNOSIS — Z992 Dependence on renal dialysis: Principal | ICD-10-CM

## 2014-04-11 ENCOUNTER — Ambulatory Visit (HOSPITAL_COMMUNITY)
Admission: RE | Admit: 2014-04-11 | Discharge: 2014-04-11 | Disposition: A | Discharge status: Home / Self Care | Payer: Medicare Other | Source: Ambulatory Visit | Attending: Nephrology | Admitting: Nephrology

## 2014-04-11 DIAGNOSIS — Z992 Dependence on renal dialysis: Secondary | ICD-10-CM | POA: Diagnosis not present

## 2014-04-11 DIAGNOSIS — Z4901 Encounter for fitting and adjustment of extracorporeal dialysis catheter: Secondary | ICD-10-CM | POA: Diagnosis present

## 2014-04-11 DIAGNOSIS — N186 End stage renal disease: Secondary | ICD-10-CM | POA: Insufficient documentation

## 2014-04-11 MED ORDER — CHLORHEXIDINE GLUCONATE 4 % EX LIQD
CUTANEOUS | Status: AC
  Filled 2014-04-11: qty 15

## 2014-04-11 MED ORDER — LIDOCAINE HCL 1 % IJ SOLN
INTRAMUSCULAR | Status: AC
  Filled 2014-04-11: qty 20

## 2014-04-11 NOTE — Procedures (Signed)
Procedure:  Perm cath removal Tunneled HD cath in right chest removed in entirety after sharp and blunt dissection. No complications.

## 2014-04-19 ENCOUNTER — Encounter: Payer: Self-pay | Admitting: Cardiology

## 2014-05-13 ENCOUNTER — Encounter: Payer: Self-pay | Admitting: Cardiology

## 2014-06-09 ENCOUNTER — Ambulatory Visit (INDEPENDENT_AMBULATORY_CARE_PROVIDER_SITE_OTHER): Payer: Medicare Other | Admitting: Cardiology

## 2014-06-09 ENCOUNTER — Encounter: Payer: Self-pay | Admitting: Cardiology

## 2014-06-09 VITALS — BP 142/84 | HR 78 | Ht 72.0 in | Wt 188.1 lb

## 2014-06-09 DIAGNOSIS — I10 Essential (primary) hypertension: Secondary | ICD-10-CM

## 2014-06-09 DIAGNOSIS — I482 Chronic atrial fibrillation, unspecified: Secondary | ICD-10-CM

## 2014-06-09 NOTE — Assessment & Plan Note (Signed)
He is now off ARB, has had some trouble with hypotension in hemodialysis. Blood pressure stable today.

## 2014-06-09 NOTE — Assessment & Plan Note (Signed)
Chronic, continue strategy of heart rate control and anticoagulation.

## 2014-06-09 NOTE — Progress Notes (Signed)
    Clinical Summary Devin Diaz is an 80 y.o.male last seen in April. Record review finds hospitalization [facility] back in June following an episode of possible vasovagal syncope. Cardiac markers were negative He has not had any recent events. Continues on hemodialysis, followed by Dr. Lowanda Diaz. Marland Kitchen  He reports no palpitations, no bleeding episodes on Coumadin. This is followed by Dr. Woody Diaz. He is now on supplemental oxygen as well.  Echocardiogram from November 2014 reported mild LVH with LVEF 21-30%, grade 1 diastolic dysfunction, mild left atrial enlargement, sclerotic aortic valve, mild to moderate mitral stenosis, mildly dilated ascending aorta.  No Known Allergies  Current Outpatient Prescriptions  Medication Sig Dispense Refill  . B Complex-C-Folic Acid (NEPHRO-VITE PO) Take 1 tablet by mouth daily with supper.       . isosorbide mononitrate (IMDUR) 30 MG 24 hr tablet Take 30 mg by mouth daily.      Marland Kitchen levETIRAcetam (KEPPRA) 500 MG tablet Take 500 mg by mouth 2 (two) times daily.       . metoprolol succinate (TOPROL-XL) 25 MG 24 hr tablet Take 12.5 mg by mouth 2 (two) times daily.       . simvastatin (ZOCOR) 20 MG tablet Take 20 mg by mouth at bedtime.      Marland Kitchen warfarin (COUMADIN) 5 MG tablet Take 5 mg by mouth daily. MANAGED BY PMD       No current facility-administered medications for this visit.    Past Medical History  Diagnosis Date  . Prostate cancer     XRT 2006  . Strabismus   . TIA (transient ischemic attack)   . Carotid artery disease   . Mixed hyperlipidemia   . Essential hypertension, benign   . Atrial fibrillation   . Seizure disorder   . COPD (chronic obstructive pulmonary disease)   . Thyroid nodule     [facility]  . Renal cell carcinoma   . ESRD on hemodialysis     Hemodialysis T/Th/S in [place]  . History of blood transfusion 2008    Nephrectomy  . Stroke     Right MCA    Social History Devin Diaz reports that he quit smoking about 28 years ago.  His smoking use included Cigarettes. He has a 80 pack-year smoking history. He has never used smokeless tobacco. Devin Diaz reports that he does not drink alcohol.  Review of Systems Appetite is fair, he has not lost weight. Other systems reviewed and negative.  Physical Examination Filed Vitals:   06/09/14 1122  BP: 142/84  Pulse: 78   Filed Weights   06/09/14 1122  Weight: 188 lb 1.9 oz (85.331 kg)    Chronically ill-appearing, but no distress.  HEENT: Conjunctiva and lids normal, oropharynx clear.  Neck: Supple, no elevated JVP or bruits.  Lungs: Clear auscultation, nonlabored.  Cardiac: Irregularly irregular, no S3.  Skin: Warm and dry.  Musculoskeletal: No kyphosis.  Extremities: 1+ edema below the knees with some stasis, distal pulses one plus.    Problem List and Plan   Atrial fibrillation Chronic, continue strategy of heart rate control and anticoagulation.  Essential hypertension, benign He is now off ARB, has had some trouble with hypotension in hemodialysis. Blood pressure stable today.    Devin Diaz, M.D., F.A.C.C.

## 2014-06-09 NOTE — Patient Instructions (Signed)

## 2014-09-29 DEATH — deceased

## 2015-08-18 IMAGING — CR DG CHEST 2V
2 series · 2 of 2 positions shown · non-contrast
Comparison: [facility] [HOSPITAL] chest radiographs
08/06/2013.

CLINICAL DATA: 83-year-old male preoperative study for fistula
creation. End-stage renal disease. Initial encounter.

EXAM:
CHEST  2 VIEW

[w chest pa]
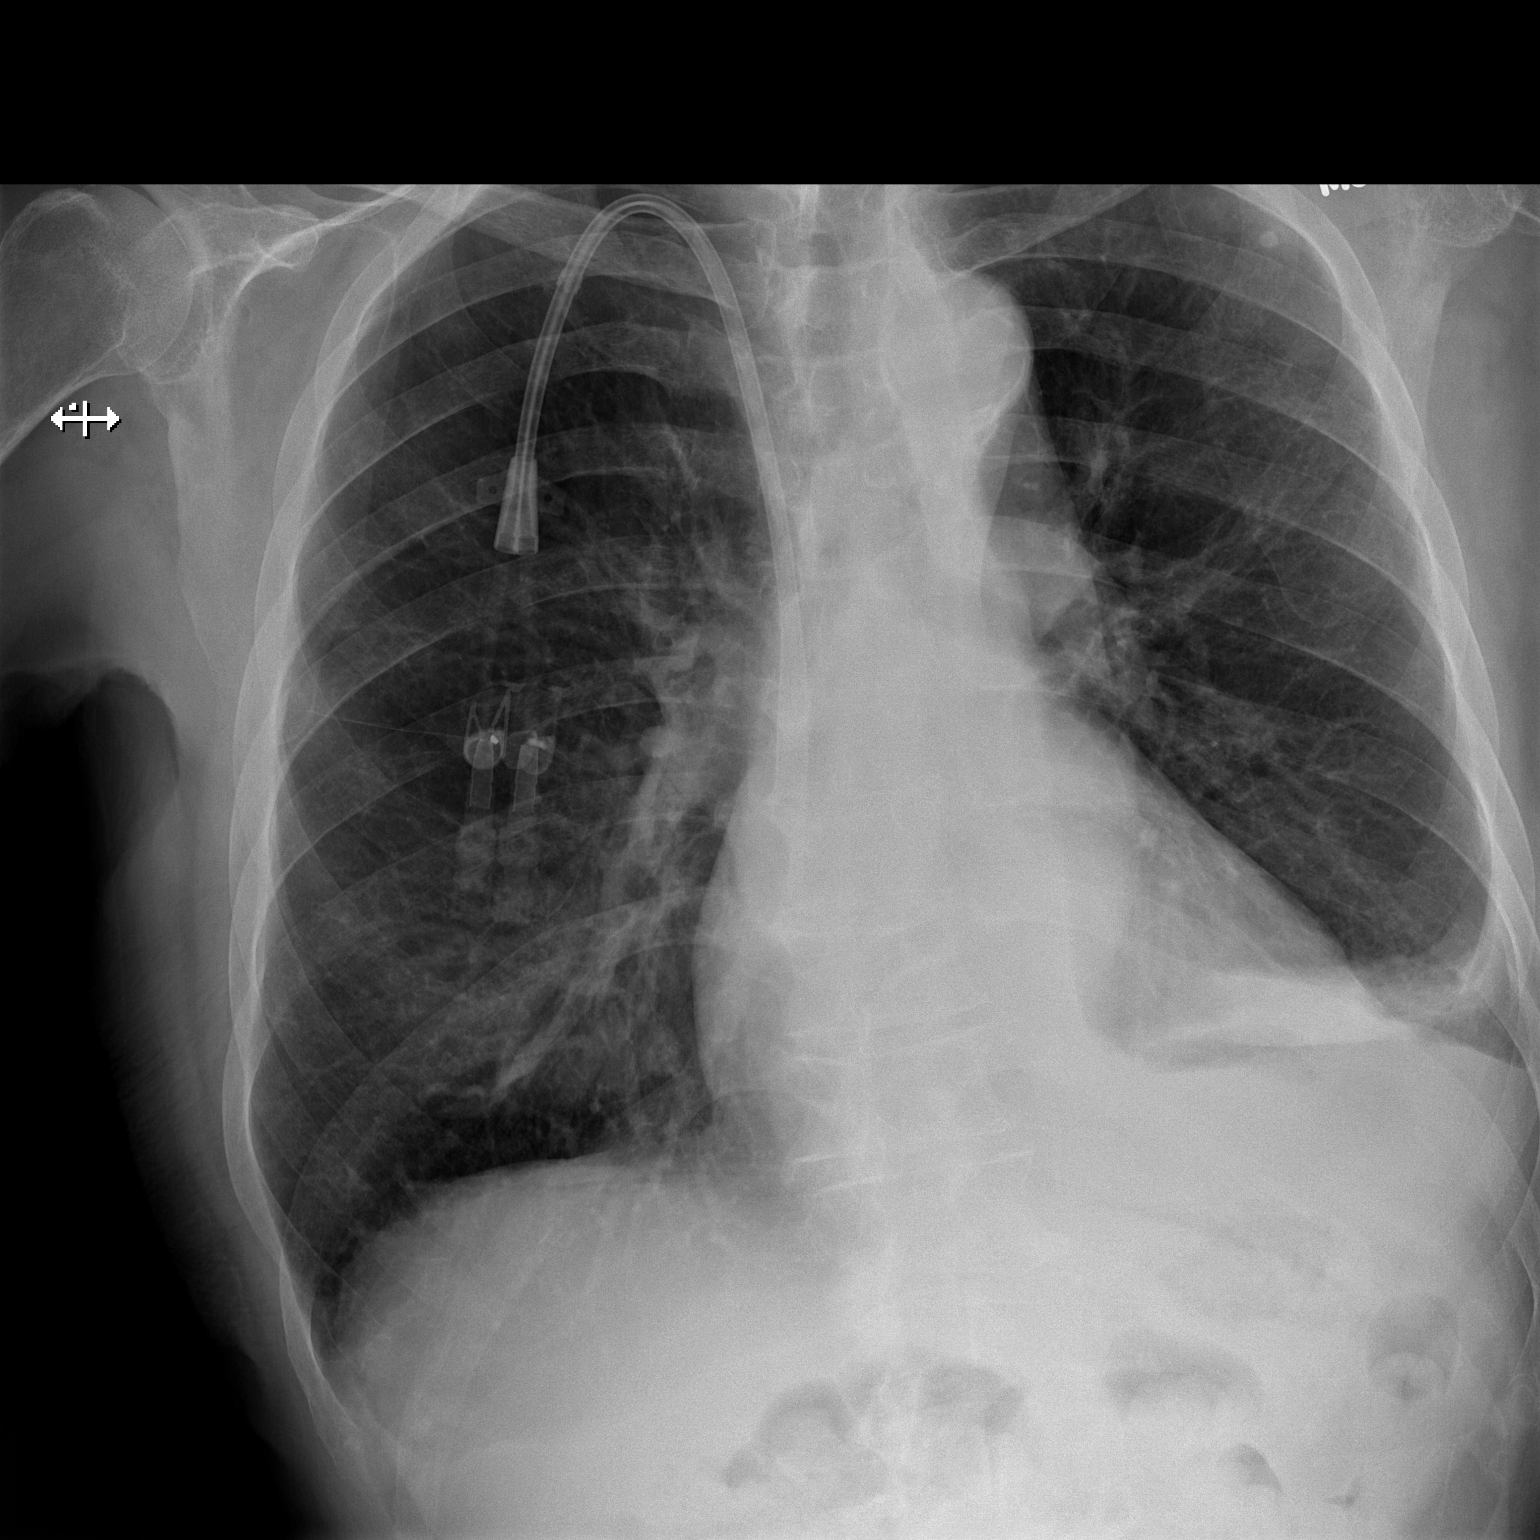

[w chest lat]
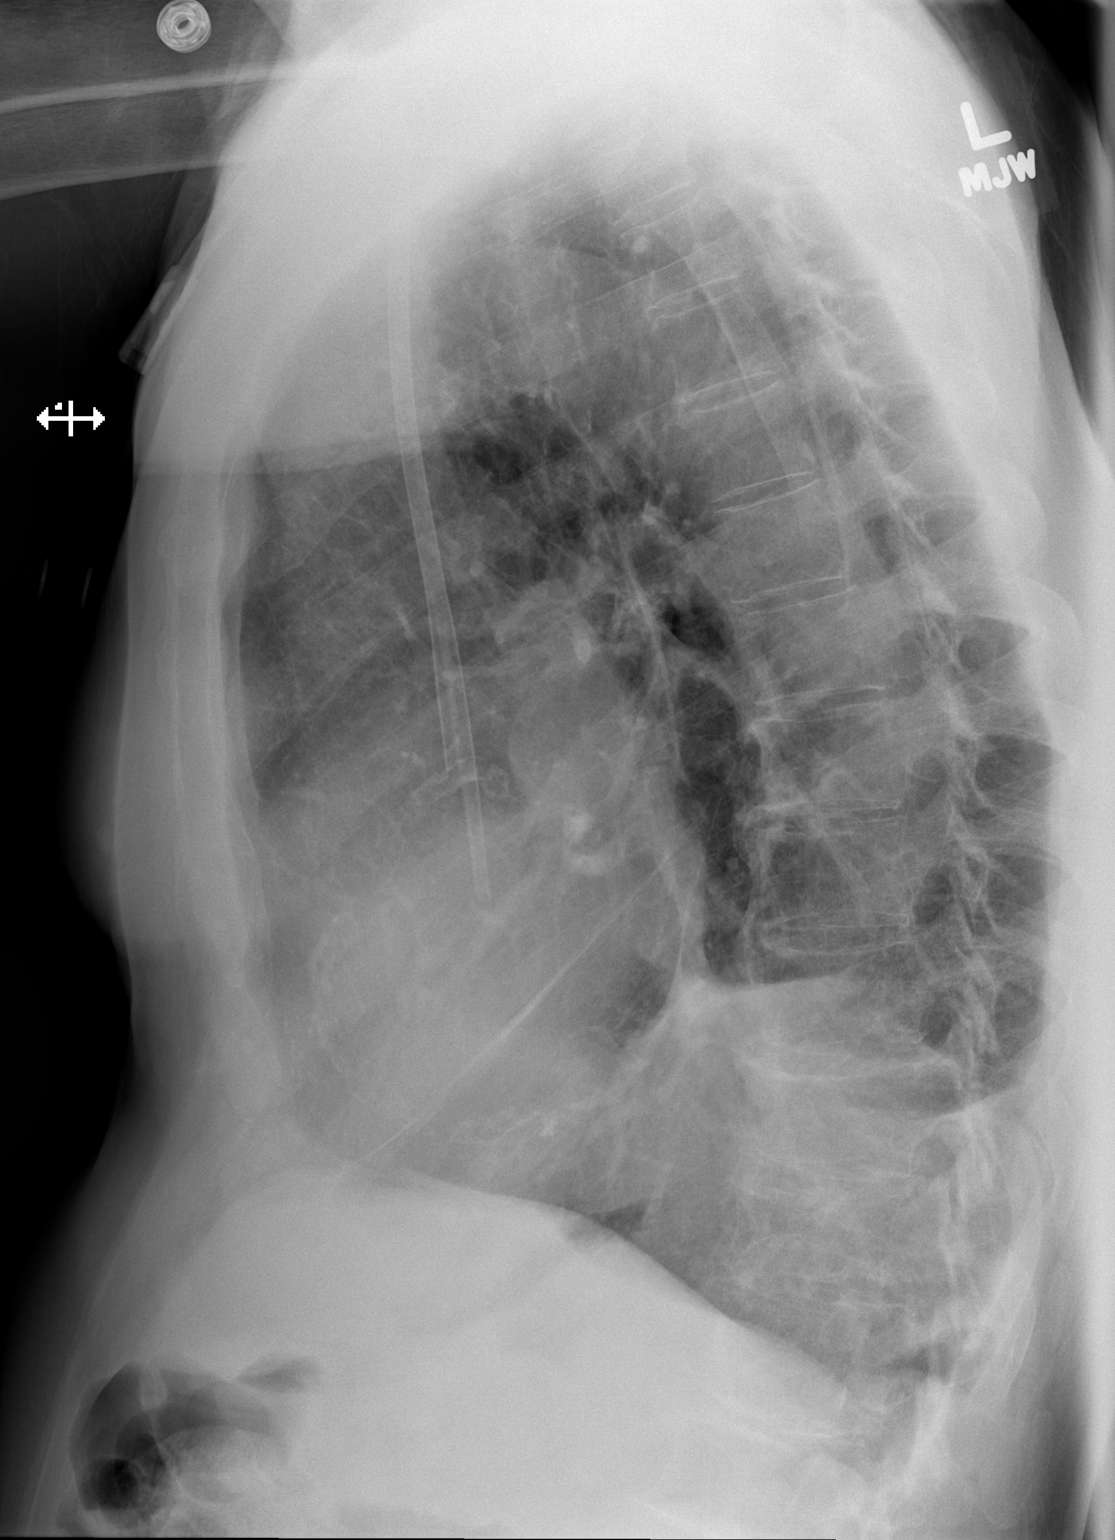

[2 of 2 positions shown; findings below may reference images not displayed]

FINDINGS: There is a degree of chronic elevation of the left hemidiaphragm.
There is increased platelike left lung base opacity today. No
superimposed pulmonary edema or pneumothorax. No definite pleural
effusion. Stable cardiac size and mediastinal contours. Right chest
tunneled tight dual lumen dialysis catheter. Visualized tracheal air
column is within normal limits. Osteopenia. No acute osseous
abnormality identified.
IMPRESSION: Increased opacity at the left lung base, favor worsening atelectasis
on chronically elevated left hemidiaphragm.
